# Patient Record
Sex: Female | Born: 1985 | Hispanic: Yes | Marital: Married | Smoking: Never smoker
Health system: Southern US, Community
[De-identification: ages and names within clinical notes are randomized; demographics above are authoritative.]

## PROBLEM LIST (undated history)

## (undated) ENCOUNTER — Emergency Department: Discharge status: Absent without leave | Payer: Self-pay

## (undated) DIAGNOSIS — D649 Anemia, unspecified: Secondary | ICD-10-CM

---

## 2011-06-18 ENCOUNTER — Emergency Department: Payer: Self-pay | Admitting: Emergency Medicine

## 2012-05-14 ENCOUNTER — Ambulatory Visit: Payer: Self-pay | Admitting: Family Medicine

## 2016-03-04 ENCOUNTER — Emergency Department
Admission: EM | Admit: 2016-03-04 | Discharge: 2016-03-04 | Disposition: A | Discharge status: Home / Self Care | Payer: Self-pay | Attending: Emergency Medicine | Admitting: Emergency Medicine

## 2016-03-04 ENCOUNTER — Emergency Department: Payer: Self-pay

## 2016-03-04 DIAGNOSIS — R0600 Dyspnea, unspecified: Secondary | ICD-10-CM

## 2016-03-04 DIAGNOSIS — R0789 Other chest pain: Secondary | ICD-10-CM | POA: Insufficient documentation

## 2016-03-04 LAB — BASIC METABOLIC PANEL
Anion gap: 6 (ref 5–15)
BUN: 15 mg/dL (ref 6–20)
CALCIUM: 8.9 mg/dL (ref 8.9–10.3)
CO2: 25 mmol/L (ref 22–32)
CREATININE: 0.57 mg/dL (ref 0.44–1.00)
Chloride: 108 mmol/L (ref 101–111)
GFR calc non Af Amer: >60 mL/min (ref >60)
Glucose, Bld: 112 mg/dL — ABNORMAL HIGH (ref 65–99)
Potassium: 3 mmol/L — ABNORMAL LOW (ref 3.5–5.1)
Sodium: 139 mmol/L (ref 135–145)

## 2016-03-04 LAB — CBC
HEMATOCRIT: 40.9 % (ref 35.0–47.0)
Hemoglobin: 13.4 g/dL (ref 12.0–16.0)
MCH: 25.4 pg — AB (ref 26.0–34.0)
MCHC: 32.9 g/dL (ref 32.0–36.0)
MCV: 77.3 fL — ABNORMAL LOW (ref 80.0–100.0)
Platelets: 192 10*3/uL (ref 150–440)
RBC: 5.29 MIL/uL — ABNORMAL HIGH (ref 3.80–5.20)
RDW: 14.4 % (ref 11.5–14.5)
WBC: 8.3 10*3/uL (ref 3.6–11.0)

## 2016-03-04 LAB — TROPONIN I: Troponin I: <0.03 ng/mL (ref <0.03)

## 2016-03-04 MED ORDER — LORAZEPAM 0.5 MG PO TABS: 0.5000 mg | ORAL_TABLET | Freq: Three times a day (TID) | ORAL | 0 refills | Status: AC | PRN

## 2016-03-04 NOTE — ED Triage Notes (Signed)
Patient to ED for complaints of shortness of breath since earlier today. States first episode was when she was driving and then again tonight was asleep, got up to go to the bathroom and felt pressure in her chest which was accompanied by shortness of breath.

## 2016-03-04 NOTE — ED Provider Notes (Signed)
[facility] Emergency Department Provider Note  Time seen: 5:16 AM  I have reviewed the triage vital signs and the nursing notes.   HISTORY  Chief Complaint Shortness of Breath    HPI Melissa Curry is a 30 y.o. female with no past medical history presents the emergency department with chest discomfort.According to the patient around 4 PM today she had a feeling of chest pressure and shortness breath, states it lasted 15 minutes or so and then went away. Patient states tonight around 4:00 in the morning she was awoken with an upset stomach like she had to have a bowel movement. States once again she felt chest pressure sensation so this time she came to the emergency department for evaluation. Denies any discomfort currently. Denies any trouble breathing. Denies any nausea or diaphoresis.  History reviewed. No pertinent past medical history.  There are no active problems to display for this patient.   History reviewed. No pertinent surgical history.  Prior to Admission medications   Not on File    No Known Allergies  No family history on file.  Social History Social History  Substance Use Topics  . Smoking status: Not on file  . Smokeless tobacco: Not on file  . Alcohol use Not on file    Review of Systems Constitutional: Negative for fever. Cardiovascular: Positive for chest pressure. Now resolved. Respiratory: Mild shortness of breath, now resolved. Gastrointestinal: Negative for abdominal pain Neurological: Negative for headache 10-point ROS otherwise negative.  ____________________________________________   PHYSICAL EXAM:  VITAL SIGNS: ED Triage Vitals  Enc Vitals Group     BP 03/04/16 0507 113/69     Pulse Rate 03/04/16 0507 67     Resp 03/04/16 0507 18     Temp 03/04/16 0507 97.8 F (36.6 C)     Temp Source 03/04/16 0507 Oral     SpO2 03/04/16 0507 100 %     Weight 03/04/16 0506 135 lb (61.2 kg)     Height 03/04/16 0506 5\' 2"   (1.575 m)     Head Circumference --      Peak Flow --      Pain Score 03/04/16 0506 0     Pain Loc --      Pain Edu? --      Excl. in GC? --     Constitutional: Alert and oriented. Well appearing and in no distress. Eyes: Normal exam ENT   Head: Normocephalic and atraumatic.   Mouth/Throat: Mucous membranes are moist. Cardiovascular: Normal rate, regular rhythm. No murmur Respiratory: Normal respiratory effort without tachypnea nor retractions. Breath sounds are clear  Gastrointestinal: Soft and nontender. No distention. Musculoskeletal: Nontender with normal range of motion in all extremities. No lower extremity tenderness or edema. Neurologic:  Normal speech and language. No gross focal neurologic deficits  Psychiatric: Mood and affect are normal. Speech and behavior are normal.   ____________________________________________    EKG  EKG reviewed and interpreted by myself shows normal sinus rhythm at 69 bpm, narrow QRS, normal axis, normal intervals, no ST changes, normal EKG.  ____________________________________________    RADIOLOGY  Chest x-ray negative  ____________________________________________   INITIAL IMPRESSION / ASSESSMENT AND PLAN / ED COURSE  Pertinent labs & imaging results that were available during my care of the patient were reviewed by me and considered in my medical decision making (see chart for details).  The patient presents with intermittent chest pressure and shortness of breath occurring twice over the past 12 hours. Denies any  currently. Overall the patient appears well with a very normal physical exam. We will check an EKG, labs and a chest x-ray. Patient agreeable to plan.  Chest x-ray negative. Labs are negative. We'll discharge the patient home with PCP follow-up, she continues to appear very well in the emergency department, pain-free.  ____________________________________________   FINAL CLINICAL IMPRESSION(S) / ED  DIAGNOSES  Chest pain    Minna Antis, MD 03/04/16 [phone]

## 2016-06-07 ENCOUNTER — Other Ambulatory Visit: Payer: Self-pay | Admitting: Family Medicine

## 2016-06-07 DIAGNOSIS — Z3481 Encounter for supervision of other normal pregnancy, first trimester: Secondary | ICD-10-CM

## 2016-06-13 ENCOUNTER — Ambulatory Visit
Admission: RE | Admit: 2016-06-13 | Discharge: 2016-06-13 | Disposition: A | Discharge status: Home / Self Care | Payer: Self-pay | Source: Ambulatory Visit | Attending: Family Medicine | Admitting: Family Medicine

## 2016-06-13 DIAGNOSIS — Z3481 Encounter for supervision of other normal pregnancy, first trimester: Secondary | ICD-10-CM

## 2016-06-13 DIAGNOSIS — Z3482 Encounter for supervision of other normal pregnancy, second trimester: Secondary | ICD-10-CM | POA: Insufficient documentation

## 2016-06-13 DIAGNOSIS — Z3A13 13 weeks gestation of pregnancy: Secondary | ICD-10-CM | POA: Insufficient documentation

## 2016-07-02 NOTE — L&D Delivery Note (Signed)
Delivery Note At 2:53 AM a viable female was delivered via Vaginal, Spontaneous Delivery (Presentation VTX: ;  ).  APGAR: 9, 9; weight  .   Placenta status:intact , .  Cord:nuchal - reduced . Delayed cord clamp after delivery   with the following complications:none .   Anesthesia:  lidocaine Episiotomy:  none Lacerations: 2nd degree( small) Suture Repair: 3.0 vicryl Est. Blood Loss (mL):  50cc  Mom to postpartum.  Baby to Couplet care / Skin to Skin.  Melissa Curry 12/20/2016, 3:25 AM

## 2016-07-17 ENCOUNTER — Other Ambulatory Visit: Payer: Self-pay | Admitting: Family Medicine

## 2016-07-17 DIAGNOSIS — Z3482 Encounter for supervision of other normal pregnancy, second trimester: Secondary | ICD-10-CM

## 2016-08-03 ENCOUNTER — Ambulatory Visit
Admission: RE | Admit: 2016-08-03 | Discharge: 2016-08-03 | Disposition: A | Discharge status: Home / Self Care | Payer: Medicaid Other | Source: Ambulatory Visit | Attending: Family Medicine | Admitting: Family Medicine

## 2016-08-03 DIAGNOSIS — Z3482 Encounter for supervision of other normal pregnancy, second trimester: Secondary | ICD-10-CM

## 2016-08-03 DIAGNOSIS — O321XX0 Maternal care for breech presentation, not applicable or unspecified: Secondary | ICD-10-CM | POA: Diagnosis not present

## 2016-08-03 DIAGNOSIS — Z3A20 20 weeks gestation of pregnancy: Secondary | ICD-10-CM | POA: Diagnosis not present

## 2016-11-05 ENCOUNTER — Other Ambulatory Visit: Payer: Self-pay | Admitting: Family Medicine

## 2016-11-05 DIAGNOSIS — Z3689 Encounter for other specified antenatal screening: Secondary | ICD-10-CM

## 2016-11-19 ENCOUNTER — Ambulatory Visit
Admission: RE | Admit: 2016-11-19 | Discharge: 2016-11-19 | Disposition: A | Discharge status: Home / Self Care | Payer: Medicaid Other | Source: Ambulatory Visit | Attending: Maternal & Fetal Medicine | Admitting: Maternal & Fetal Medicine

## 2016-11-19 ENCOUNTER — Ambulatory Visit: Payer: Medicaid Other

## 2016-11-19 DIAGNOSIS — Z3689 Encounter for other specified antenatal screening: Secondary | ICD-10-CM | POA: Insufficient documentation

## 2016-11-19 DIAGNOSIS — Z3A36 36 weeks gestation of pregnancy: Secondary | ICD-10-CM | POA: Insufficient documentation

## 2016-12-06 ENCOUNTER — Other Ambulatory Visit: Payer: Self-pay | Admitting: *Deleted

## 2016-12-06 DIAGNOSIS — O4103X1 Oligohydramnios, third trimester, fetus 1: Secondary | ICD-10-CM

## 2016-12-10 ENCOUNTER — Ambulatory Visit
Admission: RE | Admit: 2016-12-10 | Discharge: 2016-12-10 | Disposition: A | Discharge status: Home / Self Care | Payer: Medicaid Other | Source: Ambulatory Visit | Attending: Obstetrics and Gynecology | Admitting: Obstetrics and Gynecology

## 2016-12-10 DIAGNOSIS — O4103X1 Oligohydramnios, third trimester, fetus 1: Secondary | ICD-10-CM | POA: Diagnosis present

## 2016-12-10 DIAGNOSIS — Z3A39 39 weeks gestation of pregnancy: Secondary | ICD-10-CM | POA: Insufficient documentation

## 2016-12-20 ENCOUNTER — Encounter
Admission: EM | Disposition: A | Discharge status: Home / Self Care | Payer: Self-pay | Source: Home / Self Care | Attending: Obstetrics and Gynecology

## 2016-12-20 ENCOUNTER — Inpatient Hospital Stay: Payer: Medicaid Other | Admitting: Anesthesiology

## 2016-12-20 ENCOUNTER — Inpatient Hospital Stay
Admission: EM | Admit: 2016-12-20 | Discharge: 2016-12-21 | DRG: 767 | Disposition: A | Discharge status: Home / Self Care | Payer: Medicaid Other | Attending: Obstetrics and Gynecology | Admitting: Obstetrics and Gynecology

## 2016-12-20 ENCOUNTER — Encounter: Payer: Self-pay | Admitting: Obstetrics and Gynecology

## 2016-12-20 DIAGNOSIS — Z302 Encounter for sterilization: Secondary | ICD-10-CM

## 2016-12-20 DIAGNOSIS — Z3A39 39 weeks gestation of pregnancy: Secondary | ICD-10-CM

## 2016-12-20 DIAGNOSIS — O9089 Other complications of the puerperium, not elsewhere classified: Secondary | ICD-10-CM | POA: Diagnosis not present

## 2016-12-20 DIAGNOSIS — R55 Syncope and collapse: Secondary | ICD-10-CM | POA: Diagnosis not present

## 2016-12-20 DIAGNOSIS — O479 False labor, unspecified: Secondary | ICD-10-CM | POA: Diagnosis present

## 2016-12-20 HISTORY — PX: TUBAL LIGATION: SHX77

## 2016-12-20 LAB — TYPE AND SCREEN
ABO/RH(D): O POS
ANTIBODY SCREEN: NEGATIVE

## 2016-12-20 LAB — CBC
HCT: 31.9 % — ABNORMAL LOW (ref 35.0–47.0)
HEMATOCRIT: 35.6 % (ref 35.0–47.0)
HEMOGLOBIN: 10.1 g/dL — AB (ref 12.0–16.0)
Hemoglobin: 11.6 g/dL — ABNORMAL LOW (ref 12.0–16.0)
MCH: 24 pg — ABNORMAL LOW (ref 26.0–34.0)
MCH: 23.5 pg — ABNORMAL LOW (ref 26.0–34.0)
MCHC: 32.5 g/dL (ref 32.0–36.0)
MCHC: 31.7 g/dL — AB (ref 32.0–36.0)
MCV: 73.9 fL — ABNORMAL LOW (ref 80.0–100.0)
MCV: 74.1 fL — ABNORMAL LOW (ref 80.0–100.0)
PLATELETS: 210 10*3/uL (ref 150–440)
PLATELETS: 183 10*3/uL (ref 150–440)
RBC: 4.81 MIL/uL (ref 3.80–5.20)
RBC: 4.31 MIL/uL (ref 3.80–5.20)
RDW: 16 % — ABNORMAL HIGH (ref 11.5–14.5)
RDW: 15.6 % — AB (ref 11.5–14.5)
WBC: 11.6 10*3/uL — AB (ref 3.6–11.0)
WBC: 15.3 10*3/uL — ABNORMAL HIGH (ref 3.6–11.0)

## 2016-12-20 SURGERY — LIGATION, FALLOPIAN TUBE, POSTPARTUM
Anesthesia: Spinal

## 2016-12-20 MED ORDER — FERROUS SULFATE 325 (65 FE) MG PO TABS
325.0000 mg | ORAL_TABLET | Freq: Two times a day (BID) | ORAL | Status: DC
  Administered 2016-12-20 – 2016-12-21 (×2): 325 mg via ORAL
  Filled 2016-12-20 (×2): qty 1

## 2016-12-20 MED ORDER — FENTANYL CITRATE (PF) 100 MCG/2ML IJ SOLN: 25.0000 ug | INTRAMUSCULAR | Status: DC | PRN

## 2016-12-20 MED ORDER — OXYTOCIN 10 UNIT/ML IJ SOLN
INTRAMUSCULAR | Status: AC
  Filled 2016-12-20: qty 2

## 2016-12-20 MED ORDER — MEPERIDINE HCL 50 MG/ML IJ SOLN: 6.2500 mg | INTRAMUSCULAR | Status: DC | PRN

## 2016-12-20 MED ORDER — FENTANYL CITRATE (PF) 100 MCG/2ML IJ SOLN
INTRAMUSCULAR | Status: AC
  Filled 2016-12-20: qty 2

## 2016-12-20 MED ORDER — LACTATED RINGERS IV SOLN
INTRAVENOUS | Status: DC
  Administered 2016-12-20: 11:00:00 via INTRAVENOUS

## 2016-12-20 MED ORDER — ACETAMINOPHEN 325 MG PO TABS
650.0000 mg | ORAL_TABLET | ORAL | Status: DC | PRN
  Administered 2016-12-20: 650 mg via ORAL

## 2016-12-20 MED ORDER — OXYCODONE HCL 5 MG/5ML PO SOLN: 5.0000 mg | Freq: Once | ORAL | Status: DC | PRN

## 2016-12-20 MED ORDER — ZOLPIDEM TARTRATE 5 MG PO TABS: 5.0000 mg | ORAL_TABLET | Freq: Every evening | ORAL | Status: DC | PRN

## 2016-12-20 MED ORDER — SENNOSIDES-DOCUSATE SODIUM 8.6-50 MG PO TABS
2.0000 | ORAL_TABLET | ORAL | Status: DC
  Administered 2016-12-21: 2 via ORAL

## 2016-12-20 MED ORDER — ONDANSETRON HCL 4 MG/2ML IJ SOLN: 4.0000 mg | INTRAMUSCULAR | Status: DC | PRN

## 2016-12-20 MED ORDER — WITCH HAZEL-GLYCERIN EX PADS: 1.0000 "application " | MEDICATED_PAD | CUTANEOUS | Status: DC | PRN

## 2016-12-20 MED ORDER — MAGNESIUM HYDROXIDE 400 MG/5ML PO SUSP: 30.0000 mL | ORAL | Status: DC | PRN

## 2016-12-20 MED ORDER — LACTATED RINGERS IV SOLN
500.0000 mL | INTRAVENOUS | Status: DC | PRN
  Administered 2016-12-20: 10:00:00 via INTRAVENOUS

## 2016-12-20 MED ORDER — ACETAMINOPHEN 325 MG PO TABS
650.0000 mg | ORAL_TABLET | ORAL | Status: DC | PRN
  Administered 2016-12-20 – 2016-12-21 (×4): 650 mg via ORAL
  Filled 2016-12-20 (×5): qty 2

## 2016-12-20 MED ORDER — BUPIVACAINE HCL (PF) 0.5 % IJ SOLN
INTRAMUSCULAR | Status: AC
  Filled 2016-12-20: qty 30

## 2016-12-20 MED ORDER — BUPIVACAINE HCL 0.5 % IJ SOLN
INTRAMUSCULAR | Status: DC | PRN
  Administered 2016-12-20: 5 mL

## 2016-12-20 MED ORDER — OXYTOCIN 10 UNIT/ML IJ SOLN
INTRAMUSCULAR | Status: AC
  Administered 2016-12-20: 10 [IU] via INTRAMUSCULAR
  Filled 2016-12-20: qty 2

## 2016-12-20 MED ORDER — OXYCODONE HCL 5 MG PO TABS: 5.0000 mg | ORAL_TABLET | Freq: Once | ORAL | Status: DC | PRN

## 2016-12-20 MED ORDER — DIPHENHYDRAMINE HCL 25 MG PO CAPS: 25.0000 mg | ORAL_CAPSULE | Freq: Four times a day (QID) | ORAL | Status: DC | PRN

## 2016-12-20 MED ORDER — SIMETHICONE 80 MG PO CHEW: 80.0000 mg | CHEWABLE_TABLET | ORAL | Status: DC | PRN

## 2016-12-20 MED ORDER — OXYTOCIN 40 UNITS IN LACTATED RINGERS INFUSION - SIMPLE MED
INTRAVENOUS | Status: AC
  Filled 2016-12-20: qty 1000

## 2016-12-20 MED ORDER — IBUPROFEN 600 MG PO TABS
600.0000 mg | ORAL_TABLET | Freq: Four times a day (QID) | ORAL | Status: DC
  Administered 2016-12-20 – 2016-12-21 (×5): 600 mg via ORAL
  Filled 2016-12-20 (×4): qty 1

## 2016-12-20 MED ORDER — PROMETHAZINE HCL 25 MG/ML IJ SOLN: 6.2500 mg | INTRAMUSCULAR | Status: DC | PRN

## 2016-12-20 MED ORDER — MISOPROSTOL 200 MCG PO TABS
ORAL_TABLET | ORAL | Status: AC
  Filled 2016-12-20: qty 4

## 2016-12-20 MED ORDER — SOD CITRATE-CITRIC ACID 500-334 MG/5ML PO SOLN
30.0000 mL | ORAL | Status: DC | PRN
  Filled 2016-12-20: qty 30

## 2016-12-20 MED ORDER — KETOROLAC TROMETHAMINE 30 MG/ML IJ SOLN
INTRAMUSCULAR | Status: AC
  Filled 2016-12-20: qty 1

## 2016-12-20 MED ORDER — EPHEDRINE SULFATE 50 MG/ML IJ SOLN
INTRAMUSCULAR | Status: DC | PRN
  Administered 2016-12-20: 10 mg via INTRAVENOUS

## 2016-12-20 MED ORDER — AMMONIA AROMATIC IN INHA
RESPIRATORY_TRACT | Status: AC
  Filled 2016-12-20: qty 20

## 2016-12-20 MED ORDER — PRENATAL MULTIVITAMIN CH
1.0000 | ORAL_TABLET | Freq: Every day | ORAL | Status: DC
  Administered 2016-12-21: 1 via ORAL
  Filled 2016-12-20: qty 1

## 2016-12-20 MED ORDER — LIDOCAINE HCL (PF) 1 % IJ SOLN: 30.0000 mL | INTRAMUSCULAR | Status: DC | PRN

## 2016-12-20 MED ORDER — ONDANSETRON HCL 4 MG PO TABS: 4.0000 mg | ORAL_TABLET | ORAL | Status: DC | PRN

## 2016-12-20 MED ORDER — BUPIVACAINE IN DEXTROSE 0.75-8.25 % IT SOLN
INTRATHECAL | Status: DC | PRN
  Administered 2016-12-20: 2 mL via INTRATHECAL

## 2016-12-20 MED ORDER — COCONUT OIL OIL
1.0000 "application " | TOPICAL_OIL | Status: DC | PRN
  Administered 2016-12-20: 1 via TOPICAL
  Filled 2016-12-20: qty 120

## 2016-12-20 MED ORDER — MIDAZOLAM HCL 5 MG/5ML IJ SOLN
INTRAMUSCULAR | Status: DC | PRN
  Administered 2016-12-20 (×2): 1 mg via INTRAVENOUS

## 2016-12-20 MED ORDER — LIDOCAINE HCL (PF) 1 % IJ SOLN
INTRAMUSCULAR | Status: AC
  Filled 2016-12-20: qty 30

## 2016-12-20 MED ORDER — ONDANSETRON HCL 4 MG/2ML IJ SOLN: 4.0000 mg | Freq: Four times a day (QID) | INTRAMUSCULAR | Status: DC | PRN

## 2016-12-20 MED ORDER — FENTANYL CITRATE (PF) 100 MCG/2ML IJ SOLN
INTRAMUSCULAR | Status: DC | PRN
  Administered 2016-12-20: 50 ug via INTRAVENOUS
  Administered 2016-12-20: 25 ug via INTRAVENOUS

## 2016-12-20 MED ORDER — OXYTOCIN 40 UNITS IN LACTATED RINGERS INFUSION - SIMPLE MED: 2.5000 [IU]/h | INTRAVENOUS | Status: DC

## 2016-12-20 MED ORDER — BENZOCAINE-MENTHOL 20-0.5 % EX AERO: 1.0000 "application " | INHALATION_SPRAY | CUTANEOUS | Status: DC | PRN

## 2016-12-20 MED ORDER — DIBUCAINE 1 % RE OINT: 1.0000 "application " | TOPICAL_OINTMENT | RECTAL | Status: DC | PRN

## 2016-12-20 MED ORDER — OXYTOCIN BOLUS FROM INFUSION: 500.0000 mL | Freq: Once | INTRAVENOUS | Status: DC

## 2016-12-20 MED ORDER — AMMONIA AROMATIC IN INHA
RESPIRATORY_TRACT | Status: AC
  Filled 2016-12-20: qty 10

## 2016-12-20 MED ORDER — OXYCODONE-ACETAMINOPHEN 5-325 MG PO TABS: 1.0000 | ORAL_TABLET | ORAL | Status: DC | PRN

## 2016-12-20 MED ORDER — OXYCODONE-ACETAMINOPHEN 5-325 MG PO TABS: 2.0000 | ORAL_TABLET | ORAL | Status: DC | PRN

## 2016-12-20 MED ORDER — IBUPROFEN 600 MG PO TABS
ORAL_TABLET | ORAL | Status: AC
  Administered 2016-12-20: 600 mg via ORAL
  Filled 2016-12-20: qty 1

## 2016-12-20 MED ORDER — PROPOFOL 10 MG/ML IV BOLUS
INTRAVENOUS | Status: AC
  Filled 2016-12-20: qty 20

## 2016-12-20 MED ORDER — MEASLES, MUMPS & RUBELLA VAC ~~LOC~~ INJ
0.5000 mL | INJECTION | Freq: Once | SUBCUTANEOUS | Status: DC
  Filled 2016-12-20: qty 0.5

## 2016-12-20 MED ORDER — BUTORPHANOL TARTRATE 1 MG/ML IJ SOLN: 1.0000 mg | INTRAMUSCULAR | Status: DC | PRN

## 2016-12-20 MED ORDER — KETOROLAC TROMETHAMINE 30 MG/ML IJ SOLN
INTRAMUSCULAR | Status: DC | PRN
  Administered 2016-12-20: 30 mg via INTRAVENOUS

## 2016-12-20 MED ORDER — BENZOCAINE-MENTHOL 20-0.5 % EX AERO
INHALATION_SPRAY | CUTANEOUS | Status: AC
  Filled 2016-12-20: qty 56

## 2016-12-20 MED ORDER — PHENYLEPHRINE HCL 10 MG/ML IJ SOLN
INTRAMUSCULAR | Status: DC | PRN
  Administered 2016-12-20: 50 ug via INTRAVENOUS

## 2016-12-20 MED ORDER — MIDAZOLAM HCL 2 MG/2ML IJ SOLN
INTRAMUSCULAR | Status: AC
  Filled 2016-12-20: qty 2

## 2016-12-20 SURGICAL SUPPLY — 32 items
APPLICATOR COTTON TIP 6IN STRL (MISCELLANEOUS) ×3 IMPLANT
BLADE SURG SZ11 CARB STEEL (BLADE) ×3 IMPLANT
CANISTER SUCT 1200ML W/VALVE (MISCELLANEOUS) ×3 IMPLANT
CHLORAPREP W/TINT 26ML (MISCELLANEOUS) ×3 IMPLANT
CLOSURE WOUND 1/4X4 (GAUZE/BANDAGES/DRESSINGS) ×1
DERMABOND ADVANCED (GAUZE/BANDAGES/DRESSINGS) ×2
DERMABOND ADVANCED .7 DNX12 (GAUZE/BANDAGES/DRESSINGS) ×1 IMPLANT
DRAPE PED LAPAROTOMY (DRAPES) ×3 IMPLANT
DRSG TEGADERM 2-3/8X2-3/4 SM (GAUZE/BANDAGES/DRESSINGS) ×3 IMPLANT
DRSG TEGADERM 4X4.75 (GAUZE/BANDAGES/DRESSINGS) ×3 IMPLANT
ELECT CAUTERY BLADE 6.4 (BLADE) ×3 IMPLANT
ELECT REM PT RETURN 9FT ADLT (ELECTROSURGICAL) ×3
ELECTRODE REM PT RTRN 9FT ADLT (ELECTROSURGICAL) ×1 IMPLANT
GAUZE SPONGE NON-WVN 2X2 STRL (MISCELLANEOUS) ×1 IMPLANT
GLOVE BIO SURGEON STRL SZ8 (GLOVE) ×12 IMPLANT
GOWN STRL REUS W/ TWL LRG LVL3 (GOWN DISPOSABLE) ×1 IMPLANT
GOWN STRL REUS W/ TWL XL LVL3 (GOWN DISPOSABLE) ×1 IMPLANT
GOWN STRL REUS W/TWL LRG LVL3 (GOWN DISPOSABLE) ×2
GOWN STRL REUS W/TWL XL LVL3 (GOWN DISPOSABLE) ×2
KIT RM TURNOVER STRD PROC AR (KITS) ×3 IMPLANT
LABEL OR SOLS (LABEL) ×3 IMPLANT
NEEDLE HYPO 25X1 1.5 SAFETY (NEEDLE) ×3 IMPLANT
NS IRRIG 500ML POUR BTL (IV SOLUTION) ×3 IMPLANT
PACK BASIN MINOR ARMC (MISCELLANEOUS) ×3 IMPLANT
SPONGE VERSALON 2X2 STRL (MISCELLANEOUS) ×2
STRIP CLOSURE SKIN 1/4X4 (GAUZE/BANDAGES/DRESSINGS) ×2 IMPLANT
SUT PLAIN GUT 0 (SUTURE) ×6 IMPLANT
SUT VIC AB 2-0 UR6 27 (SUTURE) ×3 IMPLANT
SUT VIC AB 4-0 SH 27 (SUTURE) ×2
SUT VIC AB 4-0 SH 27XANBCTRL (SUTURE) ×1 IMPLANT
SWABSTK COMLB BENZOIN TINCTURE (MISCELLANEOUS) ×3 IMPLANT
SYRINGE 10CC LL (SYRINGE) ×3 IMPLANT

## 2016-12-20 NOTE — Progress Notes (Signed)
0615 fundal assessment found fundus deviated to right. Fundus firm and one above umbilicus. Pt up to BR, encouraged to void. Pt also c/o increased abd cramping. Pt voided and 2 clots also expelled while on BR. Unable to determine size of clots due to blood in water. Upon standing pt reported feeling dizzy. Pt eased to floor and called for assistance. IV bolus started, ammonia given. Within 5 min pt able to stand to wheelchair and to bed. Fundus firm and more midline. Dr Feliberto GottronSchermerhorn called, informed of occurrence and IV pitocin started. Stat cbc ordered.

## 2016-12-20 NOTE — Anesthesia Postprocedure Evaluation (Signed)
Anesthesia Post Note  Patient: Clayburn PertDeysi Mower  Procedure(s) Performed: Procedure(s) (LRB): POST PARTUM TUBAL LIGATION (N/A)  Patient location during evaluation: PACU Anesthesia Type: Spinal Level of consciousness: awake and alert and oriented Pain management: pain level controlled Vital Signs Assessment: post-procedure vital signs reviewed and stable Respiratory status: spontaneous breathing, nonlabored ventilation and respiratory function stable Cardiovascular status: blood pressure returned to baseline and stable Postop Assessment: no signs of nausea or vomiting Anesthetic complications: no     Last Vitals:  Vitals:   12/20/16 1223 12/20/16 1239  BP: 113/69 115/72  Pulse: (!) 57 (!) 55  Resp: 13 15  Temp:      Last Pain:  Vitals:   12/20/16 1239  TempSrc:   PainSc: 0-No pain                 Kendryck Lacroix

## 2016-12-20 NOTE — Transfer of Care (Signed)
Immediate Anesthesia Transfer of Care Note  Patient: Melissa Curry  Procedure(s) Performed: Procedure(s): POST PARTUM TUBAL LIGATION (N/A)  Patient Location: PACU  Anesthesia Type:Spinal  Level of Consciousness: awake  Airway & Oxygen Therapy: Patient Spontanous Breathing and Patient connected to nasal cannula oxygen  Post-op Assessment: Report given to RN and Post -op Vital signs reviewed and stable  Post vital signs: Reviewed and stable  Last Vitals:  Vitals:   12/20/16 0836 12/20/16 0901  BP: 120/85 116/78  Pulse: 74 65  Resp:  18  Temp:  36.9 C    Last Pain:  Vitals:   12/20/16 0925  TempSrc:   PainSc: 0-No pain         Complications: No apparent anesthesia complications

## 2016-12-20 NOTE — Discharge Summary (Signed)
  Obstetrical Discharge Summary  Patient Name: Melissa Curry DOB: 1985-12-09 MRN: [id number]  Date of Admission: 12/20/2016 Date of Delivery: 12/20/16 Delivered by: Schermerhorn MD Date of Discharge: 12/21/16 Primary OB: [facility] LMP:No LMP recorded (lmp unknown). EDC Estimated Date of Delivery: 12/18/16 Gestational Age at Delivery: 8440+2  Admitting Diagnosis: active labor Secondary Diagnosis: Patient Active Problem List   Diagnosis Date Noted  . Uterine contractions during pregnancy 12/20/2016  . Delivery normal 12/20/2016    Augmentation: none Complications: None Intrapartum complications/course:precipitous labor and delivery Date of Delivery: 12/20/16 Delivered By: SchermerhornMD Delivery Type: spontaneous vaginal delivery Anesthesia: epidural Placenta: sponatneous Laceration: small second  Episiotomy: none Newborn Data: Live born female  Birth Weight: 7 lb 1.9 oz (3230 g) APGAR: 9, 9  Postpartum Procedures: PP BTL   Post partum course:  Patient had an uncomplicated postpartum course.  By time of discharge on PPD#1, her pain was controlled on oral pain medications; she had appropriate lochia and was ambulating, voiding without difficulty and tolerating regular diet.  She was deemed stable for discharge to home.     Discharge Physical Exam:  BP 102/67 (BP Location: Left Arm)   Pulse 61   Temp 98.1 F (36.7 C) (Oral)   Resp 20   Ht 5\' 2"  (1.575 m)   Wt 74.8 kg (165 lb)   LMP  (LMP Unknown)   SpO2 100%   Breastfeeding? Unknown   BMI 30.18 kg/m   General: NAD CV: RRR Pulm: CTABL, nl effort ABD: s/nd/nt, fundus firm and below the umbilicus Lochia: moderate Incision: c/d/i DVT Evaluation: LE non-ttp, no evidence of DVT on exam.  Hemoglobin  Date Value Ref Range Status  12/21/2016 8.6 (L) 12.0 - 16.0 g/dL Final   HCT  Date Value Ref Range Status  12/21/2016 26.6 (L) 35.0 - 47.0 % Final     Disposition: stable, discharge to home. Baby Disposition: home with  mom Contraception: postpartum tubal ligation   Plan:  Melissa Curry was discharged to home in good condition. [facility] for postop visit with Dr. Feliberto GottronSchermerhorn   Discharge Medications: Allergies as of 12/21/2016   No Known Allergies     Medication List    TAKE these medications   docusate sodium 100 MG capsule Commonly known as:  COLACE Take 1 capsule (100 mg total) by mouth 2 (two) times daily as needed for mild constipation.   ferrous sulfate 325 (65 FE) MG tablet Take 1 tablet (325 mg total) by mouth daily with breakfast.   ibuprofen 600 MG tablet Commonly known as:  ADVIL,MOTRIN Take 1 tablet (600 mg total) by mouth every 6 (six) hours.   LORazepam 0.5 MG tablet Commonly known as:  ATIVAN Take 1 tablet (0.5 mg total) by mouth every 8 (eight) hours as needed for anxiety.   multivitamin-prenatal 27-0.8 MG Tabs tablet Take 1 tablet by mouth daily at 12 noon.   oxyCODONE-acetaminophen 5-325 MG tablet Commonly known as:  PERCOCET/ROXICET Take 1-2 tablets by mouth every 6 (six) hours as needed.         SignedChristeen Douglas: Kendon Sedeno 8:40 AM 12/21/16

## 2016-12-20 NOTE — Brief Op Note (Signed)
12/20/2016  10:55 AM  PATIENT:  Melissa Curry  31 y.o. female  PRE-OPERATIVE DIAGNOSIS:  desire for sterility  POST-OPERATIVE DIAGNOSIS:  desire for sterility  PROCEDURE:  Procedure(s): POST PARTUM TUBAL LIGATION (N/A)  SURGEON:  Surgeon(s) and Role:    * Karrah Mangini, Ihor Austinhomas J, MD - Primary  PHYSICIAN ASSISTANT:   ASSISTANTS: none   ANESTHESIA:   spinal  EBL:  Total I/O In: 400 [I.V.:400] Out: - minimal   BLOOD ADMINISTERED:none  DRAINS: none   LOCAL MEDICATIONS USED:  MARCAINE     SPECIMEN:  Source of Specimen:  portion of bilateral tube  DISPOSITION OF SPECIMEN:  PATHOLOGY  COUNTS:  YES  TOURNIQUET:  * No tourniquets in log *  DICTATION: .Other Dictation: Dictation Number verbal  PLAN OF CARE: continue PP care on postpartum   PATIENT DISPOSITION:  PACU - hemodynamically stable.   Delay start of Pharmacological VTE agent (>24hrs) due to surgical blood loss or risk of bleeding: not applicable

## 2016-12-20 NOTE — Progress Notes (Signed)
Patient last void was at 0457. Patient attempted to void at 1540 but was unable to void at this time. Due to this, patient was bladder scanned at 1550 which showed 870 mL. Patient in and out cathed at 1610 and 1000 mL of urine was removed from bladder. Will continue to monitor patient, RN will have patient up to try and void again in a few hours.     Oswald HillockAbigail Garner, RN

## 2016-12-20 NOTE — Anesthesia Preprocedure Evaluation (Signed)
Anesthesia Evaluation  Patient identified by MRN, date of birth, ID band Patient awake    Reviewed: Allergy & Precautions, NPO status , Patient's Chart, lab work & pertinent test results  History of Anesthesia Complications Negative for: history of anesthetic complications  Airway Mallampati: II  TM Distance: >3 FB Neck ROM: Full    Dental no notable dental hx.    Pulmonary neg pulmonary ROS, neg sleep apnea, neg COPD,    breath sounds clear to auscultation- rhonchi (-) wheezing      Cardiovascular Exercise Tolerance: Good (-) hypertension(-) CAD and (-) Past MI  Rhythm:Regular Rate:Normal - Systolic murmurs and - Diastolic murmurs    Neuro/Psych negative neurological ROS  negative psych ROS   GI/Hepatic negative GI ROS, Neg liver ROS,   Endo/Other  negative endocrine ROSneg diabetes  Renal/GU negative Renal ROS     Musculoskeletal negative musculoskeletal ROS (+)   Abdominal (+) - obese,   Peds  Hematology negative hematology ROS (+)   Anesthesia Other Findings   Reproductive/Obstetrics (+) Breast feeding                              Anesthesia Physical Anesthesia Plan  ASA: I  Anesthesia Plan: Spinal   Post-op Pain Management:    Induction:   PONV Risk Score and Plan: 2 and Ondansetron and Midazolam  Airway Management Planned: Natural Airway  Additional Equipment:   Intra-op Plan:   Post-operative Plan:   Informed Consent: I have reviewed the patients History and Physical, chart, labs and discussed the procedure including the risks, benefits and alternatives for the proposed anesthesia with the patient or authorized representative who has indicated his/her understanding and acceptance.   Dental advisory given  Plan Discussed with: Anesthesiologist and CRNA  Anesthesia Plan Comments:         Lab Results  Component Value Date   WBC 15.3 (H) 12/20/2016   HGB  10.1 (L) 12/20/2016   HCT 31.9 (L) 12/20/2016   MCV 74.1 (L) 12/20/2016   PLT 183 12/20/2016    Anesthesia Quick Evaluation

## 2016-12-20 NOTE — Anesthesia Post-op Follow-up Note (Cosign Needed)
Anesthesia QCDR form completed.        

## 2016-12-20 NOTE — Anesthesia Procedure Notes (Signed)
Spinal  Patient location during procedure: OR Start time: 12/20/2016 10:15 AM End time: 12/20/2016 10:28 AM Staffing Anesthesiologist: Randa Lynn AMY Resident/CRNA: Leyla Soliz Performed: anesthesiologist and resident/CRNA  Preanesthetic Checklist Completed: patient identified, site marked, surgical consent, pre-op evaluation, timeout performed, IV checked, risks and benefits discussed and monitors and equipment checked Spinal Block Patient position: sitting Prep: ChloraPrep Patient monitoring: heart rate, continuous pulse ox, blood pressure and cardiac monitor Approach: midline Location: L3-4 Injection technique: single-shot Needle Needle type: Introducer and Pencil-Tip  Needle gauge: 24 G Needle length: 9 cm Additional Notes Negative paresthesia. Negative blood return. Positive free-flowing CSF. Expiration date of kit checked and confirmed. Patient tolerated procedure well, without complications.

## 2016-12-20 NOTE — Progress Notes (Signed)
Patient transported to OR for BTL at this time.   Oswald HillockAbigail Garner, RN

## 2016-12-20 NOTE — Progress Notes (Signed)
  Proceed with PP BTL Patient ID: Melissa Curry, female   DOB: August 03, 1985, 31 y.o.   MRN: [id number] Presyncopal episode after delivery . HCT stable . Pt feels better this am

## 2016-12-20 NOTE — Anesthesia Procedure Notes (Signed)
Date/Time: 12/20/2016 10:15 AM Performed by: Henrietta HooverPOPE, Ashlyne Olenick Pre-anesthesia Checklist: Patient identified, Emergency Drugs available, Suction available, Patient being monitored and Timeout performed Patient Re-evaluated:Patient Re-evaluated prior to inductionOxygen Delivery Method: Nasal cannula Placement Confirmation: positive ETCO2

## 2016-12-20 NOTE — H&P (Signed)
Melissa Curry is a 31 y.o. female presenting for active Labor39+4 week by U/S and unsure LMP . OB History    Gravida Para Term Preterm AB Living   3 3 3     3    SAB TAB Ectopic Multiple Live Births         0 3     Past Medical History:  Diagnosis Date  . Medical history non-contributory    History reviewed. No pertinent surgical history. Family History: family history is not on file. Social History:  reports that she has never smoked. She has never used smokeless tobacco. She reports that she does not drink alcohol or use drugs.     Maternal Diabetes: No Genetic Screening: Declined Maternal Ultrasounds/Referrals: Normal Fetal Ultrasounds or other Referrals:  None Maternal Substance Abuse:  No Significant Maternal Medications:  None Significant Maternal Lab Results:  None Other Comments:  None  ROS History Dilation: 10 Effacement (%): 100 Blood pressure (!) 136/93, pulse 79, temperature 98 F (36.7 C), temperature source Oral, resp. rate (!) 22, height 5\' 2"  (1.575 m), weight 74.8 kg (165 lb), unknown if currently breastfeeding. Exam  Lungs CTA CV RRR Physical Exam  Prenatal labs: ABO, Rh:  0+ Antibody:  neg  Rubella:  IMM, var Imm RPR:   neg HBsAg:   neg HIV:   neg GBS:   neg   Assessment/Plan: Precipitous delivery - see delivery note    Melissa Curry 12/20/2016, 3:20 AM

## 2016-12-21 LAB — CBC
HEMATOCRIT: 26.6 % — AB (ref 35.0–47.0)
HEMOGLOBIN: 8.6 g/dL — AB (ref 12.0–16.0)
MCH: 24.3 pg — AB (ref 26.0–34.0)
MCHC: 32.3 g/dL (ref 32.0–36.0)
MCV: 75.3 fL — ABNORMAL LOW (ref 80.0–100.0)
Platelets: 155 10*3/uL (ref 150–440)
RBC: 3.53 MIL/uL — ABNORMAL LOW (ref 3.80–5.20)
RDW: 15.6 % — ABNORMAL HIGH (ref 11.5–14.5)
WBC: 8.3 10*3/uL (ref 3.6–11.0)

## 2016-12-21 LAB — RPR: RPR Ser Ql: NONREACTIVE

## 2016-12-21 MED ORDER — IBUPROFEN 600 MG PO TABS: 600.0000 mg | ORAL_TABLET | Freq: Four times a day (QID) | ORAL | 0 refills | Status: AC

## 2016-12-21 MED ORDER — DOCUSATE SODIUM 100 MG PO CAPS: 100.0000 mg | ORAL_CAPSULE | Freq: Two times a day (BID) | ORAL | 0 refills | Status: AC | PRN

## 2016-12-21 MED ORDER — DOCUSATE SODIUM 100 MG PO CAPS: 100.0000 mg | ORAL_CAPSULE | Freq: Two times a day (BID) | ORAL | Status: DC | PRN

## 2016-12-21 MED ORDER — OXYCODONE-ACETAMINOPHEN 5-325 MG PO TABS: 1.0000 | ORAL_TABLET | Freq: Four times a day (QID) | ORAL | 0 refills | Status: AC | PRN

## 2016-12-21 MED ORDER — FERROUS SULFATE 325 (65 FE) MG PO TABS: 325.0000 mg | ORAL_TABLET | Freq: Every day | ORAL | 3 refills | Status: AC

## 2016-12-21 NOTE — Brief Op Note (Signed)
12/20/2016  2:13 PM  PATIENT:  Melissa Curry  31 y.o. female  PRE-OPERATIVE DIAGNOSIS:  desire for sterility  POST-OPERATIVE DIAGNOSIS:  desire for sterility  PROCEDURE:  Procedure(s): POST PARTUM TUBAL LIGATION (N/A)  SURGEON:  Surgeon(s) and Role:     Schermerhorn, Ihor Austinhomas J, MD - Primary  PHYSICIAN ASSISTANT:   ASSISTANTS: none   ANESTHESIA:   geta EBL:  Total I/O In: 240 [P.O.:240] Out: - min ebl  BLOOD ADMINISTERED:none DRAINS: none LOCAL MEDICATIONS USED: marcaine SPECIMEN:  Portion right and left tube DISPOSITION OF SPECIMEN:  pathology COUNTS:  Yes TOURNIQUET:  none  DICTATION: verbal  PLAN OF CARE: cont pp care  PATIENT DISPOSITION:    Ihor Austinhomas J SchermerhornMD 12/20/16

## 2016-12-21 NOTE — Progress Notes (Signed)
Discharge order received from doctor. Reviewed discharge instructions and prescriptions with patient and answered all questions. Follow up appointment given. Patient verbalized understanding. ID bands checked. Patient discharged home with infant via wheelchair by nursing/auxillary.   Bilbo Carcamo Garner, RN  

## 2016-12-21 NOTE — Op Note (Signed)
[name]:  Dannemiller, [name]               ACCOUNT NO.:  [id number]  MEDICAL RECORD NO.:  [id number]  LOCATION:  LDR1                         FACILITY:  [facility]  PHYSICIAN:  Jennell Cornerhomas Koreen Lizaola, MDDATE OF BIRTH:  Sep 19, 1985  DATE OF PROCEDURE:  12/20/2016 DATE OF DISCHARGE:                              OPERATIVE REPORT   PREOPERATIVE DIAGNOSIS:  Elective permanent sterilization.  POSTOPERATIVE DIAGNOSIS:  Elective permanent sterilization.  PROCEDURE PERFORMED:  Bilateral tubal ligation, [place].  SURGEON:  Jennell Cornerhomas Evyn Kooyman, MD  SURGEON:  Jennell Cornerhomas Danamarie Minami, MD  FIRST ASSISTANT:  Certified scrub tech, [place].  INDICATION:  A 31 year old recently status post uncomplicated spontaneous vaginal delivery.  Patient has elected for permanent sterilization.  Medicaid consent form previously signed.  DESCRIPTION OF PROCEDURE:  After adequate general endotracheal anesthesia, the patient was placed in dorsal supine position.  The patient's abdomen was prepped and draped in normal sterile fashion. Time-out was performed.  A 15 mm infraumbilical incision was made, and the fascia was opened sharply and the peritoneum was identified and opened sharply as well.  The right fallopian tube was identified, grasped with a Babcock clamp, and 2 separate 0 plain gut sutures were applied to the fallopian tube and a 1.5 cm portion of fallopian tube was removed.  Good hemostasis was noted.  Similar procedure was repeated on the patient's left fallopian tube after identifying the fimbriated end of the fallopian tube, 2 separate 0 plain gut sutures were applied to the midportion of the fallopian tube and a 1.5 cm portion of the fallopian tube was removed.  Good hemostasis noted.  Fascia was closed with a running 2-0 Vicryl suture, and the skin was reapproximated with interrupted 4-0 Vicryl suture.  INTRAOPERATIVE FLUIDS:  400 mL.  ESTIMATED BLOOD LOSS:  Minimal.  The patient tolerated the procedure well,  was taken to the recovery room in good condition.          ______________________________ Jennell Cornerhomas Arne Schlender, MD     TS/MEDQ  D:  12/21/2016  T:  12/21/2016  Job:  [id number]

## 2016-12-21 NOTE — Discharge Instructions (Signed)
Please call your doctor or return to the ER if you experience any chest pains, shortness of breath, dizziness, visual changes, fever greater than 101, any heavy bleeding (saturating more than 1 pad per hour), large clots, or foul smelling discharge, any worsening abdominal pain and cramping that is not controlled by pain medication, or any signs of postpartum depression. No tampons, enemas, douches, or sexual intercourse for 6 weeks. Also avoid tub baths, hot tubs, or swimming for 6 weeks.  °

## 2016-12-24 LAB — SURGICAL PATHOLOGY

## 2017-01-18 ENCOUNTER — Encounter
Admission: RE | Admit: 2017-01-18 | Discharge: 2017-01-18 | Disposition: A | Discharge status: Home / Self Care | Payer: Medicaid Other | Source: Ambulatory Visit | Attending: Obstetrics and Gynecology | Admitting: Obstetrics and Gynecology

## 2017-01-18 NOTE — Patient Instructions (Signed)
  Your procedure is scheduled on: 01-29-17 TUESDAY Report to Same Day Surgery 2nd floor medical mall [facility](Medical Mall Entrance-take elevator on left to 2nd floor.  Check in with surgery information desk.) To find out your arrival time please call [phone] between 1PM - 3PM on 01-27-17 MONDAY  Remember: Instructions that are not followed completely may result in serious medical risk, up to and including death, or upon the discretion of your surgeon and anesthesiologist your surgery may need to be rescheduled.    _x___ 1. Do not eat food or drink liquids after midnight. No gum chewing or hard candies.     __x__ 2. No Alcohol for 24 hours before or after surgery.   __x__3. No Smoking for 24 prior to surgery.   ____  4. Bring all medications with you on the day of surgery if instructed.    __x__ 5. Notify your doctor if there is any change in your medical condition     (cold, fever, infections).     Do not wear jewelry, make-up, hairpins, clips or nail polish.  Do not wear lotions, powders, or perfumes. You may wear deodorant.  Do not shave 48 hours prior to surgery. Men may shave face and neck.  Do not bring valuables to the hospital.    [facility] is not responsible for any belongings or valuables.               Contacts, dentures or bridgework may not be worn into surgery.  Leave your suitcase in the car. After surgery it may be brought to your room.  For patients admitted to the hospital, discharge time is determined by your treatment team.   Patients discharged the day of surgery will not be allowed to drive home.  You will need someone to drive you home and stay with you the night of your procedure.    Please read over the following fact sheets that you were given:    ____ Take anti-hypertensive (unless it includes a diuretic), cardiac, seizure, asthma,     anti-reflux and psychiatric medicines. These include:  1. NONE  2.  3.  4.  5.  6.  ____Fleets enema or Magnesium  Citrate as directed.   _x___ Use CHG Soap or sage wipes as directed on instruction sheet   ____ Use inhalers on the day of surgery and bring to hospital day of surgery  ____ Stop Metformin and Janumet 2 days prior to surgery.    ____ Take 1/2 of usual insulin dose the night before surgery and none on the morning surgery.   ____ Follow recommendations from Cardiologist, Pulmonologist or PCP regarding stopping Aspirin, Coumadin, Pllavix ,Eliquis, Effient, or Pradaxa, and Pletal.  X____Stop Anti-inflammatories such as Advil, Aleve, Ibuprofen, Motrin, Naproxen, Naprosyn, Goodies powders or aspirin products NOW-OK to take Tylenol    ____ Stop supplements until after surgery.     ____ Bring C-Pap to the hospital.

## 2017-01-18 NOTE — H&P (Signed)
Ms. Melissa Curry is a 31 y.o. female here for Post Operative Visit . Pt here for follow up for a PP tubal on 12/20/16 that failed to show any portion of Right tube . Pt desires to have a follow on procedure ( L/S right tubal / cautery to ensure she is sterile)  Past Medical History:  has no past medical history on file.  Past Surgical History:  has a past surgical history that includes Tubal ligation. Family History: family history is not on file. Social History:  reports that she has never smoked. She has never used smokeless tobacco. She reports that she does not drink alcohol. OB/GYN History:          OB History    Gravida Para Term Preterm AB Living   3 3       3    SAB TAB Ectopic Molar Multiple Live Births             3      Allergies: has No Known Allergies. Medications:  Current Outpatient Prescriptions:  .  ferrous sulfate 325 (65 FE) MG tablet, Take by mouth., Disp: , Rfl:  .  prenatal vits96-iron fum-folic 27 mg iron- 800 mcg Tab, Take by mouth., Disp: , Rfl:   Review of Systems: General:                      No fatigue or weight loss Eyes:                           No vision changes Ears:                            No hearing difficulty Respiratory:                No cough or shortness of breath Pulmonary:                  No asthma or shortness of breath Cardiovascular:           No chest pain, palpitations, dyspnea on exertion Gastrointestinal:          No abdominal bloating, chronic diarrhea, constipations, masses, pain or hematochezia Genitourinary:             No hematuria, dysuria, abnormal vaginal discharge, pelvic pain, Menometrorrhagia Lymphatic:                   No swollen lymph nodes Musculoskeletal:         No muscle weakness Neurologic:                  No extremity weakness, syncope, seizure disorder Psychiatric:                  No history of depression, delusions or suicidal/homicidal ideation    Exam:      Vitals:   01/08/17 1441   BP: 112/70  Pulse: 83    Body mass index is 24.46 kg/m.  WDWN hispanic female in NAD   Lungs: CTA  CV : RRR without murmur    Neck:  no thyromegaly Abdomen: soft , no mass, normal active bowel sounds,  non-tender, no rebound tenderness Pelvic: tanner stage 5 ,  External genitalia: vulva /labia no lesions Urethra: no prolapse Vagina: normal physiologic d/c Cervix: no lesions, no cervical motion tenderness   Uterus: normal size shape and contour,  non-tender Adnexa: no mass,  non-tender   Rectovaginal:   Impression:   The encounter diagnosis was Encounter for sterilization.PPBTL without evidence of successful procedure     Plan:   L/S BTL  Benefits and risks to surgery: The proposed benefit of the surgery has been discussed with the patient. The possible risks include, but are not limited to: organ injury to the bowel , bladder, ureters, and major blood vessels and nerves. There is a possibility of additional surgeries resulting from these injuries. There is also the risk of blood transfusion and the need to receive blood products during or after the procedure which may rarely lead to HIV or Hepatitis C infection. There is a risk of developing a deep venous thrombosis or a pulmonary embolism . There is the possibility of wound infection and also anesthetic complications, even the rare possibility of death. The patient understands these risks and wishes to proceed. All questions have been answered and the consent has been signed.    Vilma Prader, MD

## 2017-01-23 ENCOUNTER — Encounter
Admission: RE | Admit: 2017-01-23 | Discharge: 2017-01-23 | Disposition: A | Discharge status: Home / Self Care | Payer: Medicaid Other | Source: Ambulatory Visit | Attending: Obstetrics and Gynecology | Admitting: Obstetrics and Gynecology

## 2017-01-23 DIAGNOSIS — Z302 Encounter for sterilization: Secondary | ICD-10-CM | POA: Diagnosis not present

## 2017-01-23 DIAGNOSIS — Z01812 Encounter for preprocedural laboratory examination: Secondary | ICD-10-CM | POA: Diagnosis present

## 2017-01-23 LAB — BASIC METABOLIC PANEL
Anion gap: 9 (ref 5–15)
BUN: 11 mg/dL (ref 6–20)
CALCIUM: 9.2 mg/dL (ref 8.9–10.3)
CO2: 25 mmol/L (ref 22–32)
CREATININE: 0.53 mg/dL (ref 0.44–1.00)
Chloride: 105 mmol/L (ref 101–111)
GFR calc non Af Amer: >60 mL/min (ref >60)
Glucose, Bld: 72 mg/dL (ref 65–99)
Potassium: 4 mmol/L (ref 3.5–5.1)
Sodium: 139 mmol/L (ref 135–145)

## 2017-01-23 LAB — CBC
HCT: 35.6 % (ref 35.0–47.0)
Hemoglobin: 11.3 g/dL — ABNORMAL LOW (ref 12.0–16.0)
MCH: 23.7 pg — AB (ref 26.0–34.0)
MCHC: 31.8 g/dL — AB (ref 32.0–36.0)
MCV: 74.5 fL — ABNORMAL LOW (ref 80.0–100.0)
Platelets: 212 10*3/uL (ref 150–440)
RBC: 4.78 MIL/uL (ref 3.80–5.20)
RDW: 17.7 % — AB (ref 11.5–14.5)
WBC: 7.8 10*3/uL (ref 3.6–11.0)

## 2017-01-23 LAB — TYPE AND SCREEN
ABO/RH(D): O POS
Antibody Screen: NEGATIVE
EXTEND SAMPLE REASON: UNDETERMINED

## 2017-01-28 ENCOUNTER — Encounter: Payer: Self-pay | Admitting: *Deleted

## 2017-01-29 ENCOUNTER — Ambulatory Visit: Payer: Medicaid Other | Admitting: Anesthesiology

## 2017-01-29 ENCOUNTER — Encounter
Admission: RE | Disposition: A | Discharge status: Home / Self Care | Payer: Self-pay | Source: Ambulatory Visit | Attending: Obstetrics and Gynecology

## 2017-01-29 ENCOUNTER — Encounter: Payer: Self-pay | Admitting: *Deleted

## 2017-01-29 ENCOUNTER — Ambulatory Visit
Admission: RE | Admit: 2017-01-29 | Discharge: 2017-01-29 | Disposition: A | Discharge status: Home / Self Care | Payer: Medicaid Other | Source: Ambulatory Visit | Attending: Obstetrics and Gynecology | Admitting: Obstetrics and Gynecology

## 2017-01-29 DIAGNOSIS — N736 Female pelvic peritoneal adhesions (postinfective): Secondary | ICD-10-CM | POA: Diagnosis not present

## 2017-01-29 DIAGNOSIS — Z302 Encounter for sterilization: Secondary | ICD-10-CM | POA: Diagnosis not present

## 2017-01-29 HISTORY — DX: Anemia, unspecified: D64.9

## 2017-01-29 HISTORY — PX: LAPAROSCOPIC TUBAL LIGATION: SHX1937

## 2017-01-29 LAB — TYPE AND SCREEN
ABO/RH(D): O POS
Antibody Screen: NEGATIVE

## 2017-01-29 LAB — POCT PREGNANCY, URINE: Preg Test, Ur: NEGATIVE

## 2017-01-29 SURGERY — LIGATION, FALLOPIAN TUBE, LAPAROSCOPIC
Anesthesia: General | Laterality: Right | Wound class: Clean Contaminated

## 2017-01-29 MED ORDER — LACTATED RINGERS IV SOLN: INTRAVENOUS | Status: DC

## 2017-01-29 MED ORDER — LIDOCAINE HCL (PF) 2 % IJ SOLN
INTRAMUSCULAR | Status: AC
  Filled 2017-01-29: qty 2

## 2017-01-29 MED ORDER — EPHEDRINE SULFATE 50 MG/ML IJ SOLN
INTRAMUSCULAR | Status: AC
Start: 2017-01-29 — End: ?
  Filled 2017-01-29: qty 1

## 2017-01-29 MED ORDER — FENTANYL CITRATE (PF) 100 MCG/2ML IJ SOLN
INTRAMUSCULAR | Status: AC
  Filled 2017-01-29: qty 2

## 2017-01-29 MED ORDER — BUPIVACAINE HCL 0.5 % IJ SOLN
INTRAMUSCULAR | Status: DC | PRN
  Administered 2017-01-29: 8 mL

## 2017-01-29 MED ORDER — PROPOFOL 10 MG/ML IV BOLUS
INTRAVENOUS | Status: DC | PRN
  Administered 2017-01-29: 120 mg via INTRAVENOUS

## 2017-01-29 MED ORDER — ONDANSETRON HCL 4 MG/2ML IJ SOLN
INTRAMUSCULAR | Status: DC | PRN
  Administered 2017-01-29: 4 mg via INTRAVENOUS

## 2017-01-29 MED ORDER — ONDANSETRON HCL 4 MG/2ML IJ SOLN: 4.0000 mg | Freq: Once | INTRAMUSCULAR | Status: DC | PRN

## 2017-01-29 MED ORDER — FENTANYL CITRATE (PF) 100 MCG/2ML IJ SOLN
INTRAMUSCULAR | Status: DC | PRN
  Administered 2017-01-29: 25 ug via INTRAVENOUS
  Administered 2017-01-29: 50 ug via INTRAVENOUS

## 2017-01-29 MED ORDER — MIDAZOLAM HCL 2 MG/2ML IJ SOLN
INTRAMUSCULAR | Status: DC | PRN
  Administered 2017-01-29: 2 mg via INTRAVENOUS

## 2017-01-29 MED ORDER — MIDAZOLAM HCL 2 MG/2ML IJ SOLN
INTRAMUSCULAR | Status: AC
  Filled 2017-01-29: qty 2

## 2017-01-29 MED ORDER — ROCURONIUM BROMIDE 100 MG/10ML IV SOLN
INTRAVENOUS | Status: DC | PRN
  Administered 2017-01-29: 5 mg via INTRAVENOUS
  Administered 2017-01-29: 15 mg via INTRAVENOUS

## 2017-01-29 MED ORDER — SUCCINYLCHOLINE CHLORIDE 20 MG/ML IJ SOLN
INTRAMUSCULAR | Status: DC | PRN
  Administered 2017-01-29: 100 mg via INTRAVENOUS

## 2017-01-29 MED ORDER — LIDOCAINE HCL (CARDIAC) 20 MG/ML IV SOLN
INTRAVENOUS | Status: DC | PRN
  Administered 2017-01-29: 40 mg via INTRAVENOUS

## 2017-01-29 MED ORDER — LACTATED RINGERS IV SOLN
INTRAVENOUS | Status: DC
  Administered 2017-01-29 (×2): via INTRAVENOUS

## 2017-01-29 MED ORDER — FENTANYL CITRATE (PF) 100 MCG/2ML IJ SOLN: 25.0000 ug | INTRAMUSCULAR | Status: DC | PRN

## 2017-01-29 MED ORDER — FAMOTIDINE 20 MG PO TABS
ORAL_TABLET | ORAL | Status: AC
  Administered 2017-01-29: 20 mg via ORAL
  Filled 2017-01-29: qty 1

## 2017-01-29 MED ORDER — DEXAMETHASONE SODIUM PHOSPHATE 10 MG/ML IJ SOLN
INTRAMUSCULAR | Status: DC | PRN
  Administered 2017-01-29: 10 mg via INTRAVENOUS

## 2017-01-29 MED ORDER — DEXAMETHASONE SODIUM PHOSPHATE 10 MG/ML IJ SOLN
INTRAMUSCULAR | Status: AC
  Filled 2017-01-29: qty 1

## 2017-01-29 MED ORDER — PROPOFOL 10 MG/ML IV BOLUS
INTRAVENOUS | Status: AC
  Filled 2017-01-29: qty 20

## 2017-01-29 MED ORDER — FAMOTIDINE 20 MG PO TABS
20.0000 mg | ORAL_TABLET | Freq: Once | ORAL | Status: AC
  Administered 2017-01-29: 20 mg via ORAL

## 2017-01-29 MED ORDER — KETOROLAC TROMETHAMINE 30 MG/ML IJ SOLN
INTRAMUSCULAR | Status: DC | PRN
Start: 2017-01-29 — End: 2017-01-29
  Administered 2017-01-29: 30 mg via INTRAVENOUS

## 2017-01-29 MED ORDER — SUGAMMADEX SODIUM 200 MG/2ML IV SOLN
INTRAVENOUS | Status: DC | PRN
  Administered 2017-01-29: 140 mg via INTRAVENOUS

## 2017-01-29 MED ORDER — BUPIVACAINE HCL (PF) 0.5 % IJ SOLN
INTRAMUSCULAR | Status: AC
  Filled 2017-01-29: qty 30

## 2017-01-29 SURGICAL SUPPLY — 27 items
BLADE SURG SZ11 CARB STEEL (BLADE) ×3 IMPLANT
CATH ROBINSON RED A/P 16FR (CATHETERS) ×3 IMPLANT
CLOSURE WOUND 1/2 X4 (GAUZE/BANDAGES/DRESSINGS)
CLOSURE WOUND 1/4X4 (GAUZE/BANDAGES/DRESSINGS)
GLOVE BIO SURGEON STRL SZ8 (GLOVE) ×3 IMPLANT
GOWN STRL REUS W/ TWL LRG LVL3 (GOWN DISPOSABLE) ×1 IMPLANT
GOWN STRL REUS W/ TWL XL LVL3 (GOWN DISPOSABLE) ×1 IMPLANT
GOWN STRL REUS W/TWL LRG LVL3 (GOWN DISPOSABLE) ×2
GOWN STRL REUS W/TWL XL LVL3 (GOWN DISPOSABLE) ×2
KIT RM TURNOVER CYSTO AR (KITS) ×3 IMPLANT
LABEL OR SOLS (LABEL) IMPLANT
NS IRRIG 500ML POUR BTL (IV SOLUTION) ×3 IMPLANT
PACK GYN LAPAROSCOPIC (MISCELLANEOUS) ×3 IMPLANT
PAD OB MATERNITY 4.3X12.25 (PERSONAL CARE ITEMS) ×3 IMPLANT
PAD PREP 24X41 OB/GYN DISP (PERSONAL CARE ITEMS) ×3 IMPLANT
SCISSORS METZENBAUM CVD 33 (INSTRUMENTS) ×3 IMPLANT
SLEEVE ENDOPATH XCEL 5M (ENDOMECHANICALS) ×3 IMPLANT
STRIP CLOSURE SKIN 1/2X4 (GAUZE/BANDAGES/DRESSINGS) IMPLANT
STRIP CLOSURE SKIN 1/4X4 (GAUZE/BANDAGES/DRESSINGS) IMPLANT
SUT VIC AB 0 CT1 36 (SUTURE) IMPLANT
SUT VIC AB 2-0 UR6 27 (SUTURE) ×3 IMPLANT
SUT VIC AB 4-0 SH 27 (SUTURE) ×2
SUT VIC AB 4-0 SH 27XANBCTRL (SUTURE) ×1 IMPLANT
SWABSTK COMLB BENZOIN TINCTURE (MISCELLANEOUS) IMPLANT
TROCAR ENDO BLADELESS 11MM (ENDOMECHANICALS) IMPLANT
TROCAR XCEL NON-BLD 5MMX100MML (ENDOMECHANICALS) ×3 IMPLANT
TUBING INSUFFLATOR HI FLOW (MISCELLANEOUS) ×3 IMPLANT

## 2017-01-29 NOTE — Anesthesia Post-op Follow-up Note (Cosign Needed)
Anesthesia QCDR form completed.        

## 2017-01-29 NOTE — Anesthesia Preprocedure Evaluation (Signed)
Anesthesia Evaluation  Patient identified by MRN, date of birth, ID band Patient awake    Reviewed: Allergy & Precautions, H&P , NPO status , Patient's Chart, lab work & pertinent test results, reviewed documented beta blocker date and time   Airway Mallampati: II  TM Distance: >3 FB Neck ROM: full    Dental  (+) Teeth Intact   Pulmonary neg pulmonary ROS,    Pulmonary exam normal        Cardiovascular negative cardio ROS Normal cardiovascular exam Rhythm:regular Rate:Normal     Neuro/Psych negative neurological ROS  negative psych ROS   GI/Hepatic negative GI ROS, Neg liver ROS,   Endo/Other  negative endocrine ROS  Renal/GU negative Renal ROS  negative genitourinary   Musculoskeletal   Abdominal   Peds  Hematology negative hematology ROS (+) anemia ,   Anesthesia Other Findings Past Medical History: No date: Anemia Past Surgical History: 12/20/2016: TUBAL LIGATION; N/A     Comment:  Procedure: POST PARTUM TUBAL LIGATION;  Surgeon:               Suzy BouchardSchermerhorn, Thomas J, MD;  Location: [facility] ORS;                Service: Gynecology;  Laterality: N/A;   Reproductive/Obstetrics negative OB ROS                             Anesthesia Physical Anesthesia Plan  ASA: II  Anesthesia Plan: General ETT   Post-op Pain Management:    Induction:   PONV Risk Score and Plan:   Airway Management Planned:   Additional Equipment:   Intra-op Plan:   Post-operative Plan:   Informed Consent: I have reviewed the patients History and Physical, chart, labs and discussed the procedure including the risks, benefits and alternatives for the proposed anesthesia with the patient or authorized representative who has indicated his/her understanding and acceptance.   Dental Advisory Given  Plan Discussed with: CRNA  Anesthesia Plan Comments:         Anesthesia Quick Evaluation

## 2017-01-29 NOTE — Anesthesia Postprocedure Evaluation (Signed)
Anesthesia Post Note  Patient: Melissa PertDeysi Curry  Procedure(s) Performed: Procedure(s) (LRB): LAPAROSCOPIC TUBAL LIGATION (Right)  Patient location during evaluation: PACU Anesthesia Type: General Level of consciousness: awake and alert Pain management: pain level controlled Vital Signs Assessment: post-procedure vital signs reviewed and stable Respiratory status: spontaneous breathing, nonlabored ventilation, respiratory function stable and patient connected to nasal cannula oxygen Cardiovascular status: blood pressure returned to baseline and stable Postop Assessment: no signs of nausea or vomiting Anesthetic complications: no     Last Vitals:  Vitals:   01/29/17 0925 01/29/17 1030  BP: 129/76 122/86  Pulse: (!) 55 80  Resp: 16 16  Temp: (!) 36.1 C     Last Pain:  Vitals:   01/29/17 1030  TempSrc:   PainSc: 3                  Yevette EdwardsJames G Adams

## 2017-01-29 NOTE — Progress Notes (Signed)
Ready for L/S right tubal cautery  NPO . All questions answered  Preceed

## 2017-01-29 NOTE — Transfer of Care (Signed)
Immediate Anesthesia Transfer of Care Note  Patient: Melissa Curry  Procedure(s) Performed: Procedure(s): LAPAROSCOPIC TUBAL LIGATION (Right)  Patient Location: PACU  Anesthesia Type:General  Level of Consciousness: sedated  Airway & Oxygen Therapy: Patient Spontanous Breathing and Patient connected to nasal cannula oxygen  Post-op Assessment: Report given to RN and Post -op Vital signs reviewed and stable  Post vital signs: Reviewed and stable  Last Vitals:  Vitals:   01/29/17 0624  BP: 112/77  Pulse: 69  Resp: 14  Temp: (!) 36.4 C    Last Pain:  Vitals:   01/29/17 0624  TempSrc: Tympanic  PainSc: 0-No pain         Complications: No apparent anesthesia complications

## 2017-01-29 NOTE — Brief Op Note (Signed)
01/29/2017  8:26 AM  PATIENT:  Clayburn Perteysi Stinnette  31 y.o. female  PRE-OPERATIVE DIAGNOSIS:  Elective Sterilization  No fallopian tube lumen seen on pathology from 12/20/2016  POST-OPERATIVE DIAGNOSIS:  Elective Sterilization  No fallopian tube lumen seen on pathology   PROCEDURE:  Procedure(s): LAPAROSCOPIC TUBAL LIGATION (Right) L/s bilateral tubal cautery  LOA SURGEON:  Surgeon(s) and Role:    * Schermerhorn, Ihor Austinhomas J, MD - Primary  PHYSICIAN ASSISTANT:   ASSISTANTS: none   ANESTHESIA:   general  EBL:  Total I/O In: 500 [I.V.:500] Out: 305 [Urine:300; Blood:5]  BLOOD ADMINISTERED:none  DRAINS: none   LOCAL MEDICATIONS USED:  MARCAINE     SPECIMEN:  No Specimen  DISPOSITION OF SPECIMEN:  N/A  COUNTS:  YES  TOURNIQUET:  * No tourniquets in log *  DICTATION: .Other Dictation: Dictation Number verbal  PLAN OF CARE: Discharge to home after PACU  PATIENT DISPOSITION:  PACU - hemodynamically stable.   Delay start of Pharmacological VTE agent (>24hrs) due to surgical blood loss or risk of bleeding: not applicable

## 2017-01-29 NOTE — Discharge Instructions (Signed)
Laparoscopic Tubal Ligation, Care After °Refer to this sheet in the next few weeks. These instructions provide you with information about caring for yourself after your procedure. Your health care provider may also give you more specific instructions. Your treatment has been planned according to current medical practices, but problems sometimes occur. Call your health care provider if you have any problems or questions after your procedure. °What can I expect after the procedure? °After the procedure, it is common to have: °· A sore throat. °· Discomfort in your shoulder. °· Mild discomfort or cramping in your abdomen. °· Gas pains. °· Pain or soreness in the area where the surgical cut (incision) was made. °· A bloated feeling. °· Tiredness. °· Nausea. °· Vomiting. ° °Follow these instructions at home: °Medicines °· Take over-the-counter and prescription medicines only as told by your health care provider. °· Do not take aspirin because it can cause bleeding. °· Do not drive or operate heavy machinery while taking prescription pain medicine. °Activity °· Rest for the rest of the day. °· Return to your normal activities as told by your health care provider. Ask your health care provider what activities are safe for you. °Incision care ° °· Follow instructions from your health care provider about how to take care of your incision. Make sure you: °? Wash your hands with soap and water before you change your bandage (dressing). If soap and water are not available, use hand sanitizer. °? Change your dressing as told by your health care provider. °? Leave stitches (sutures) in place. They may need to stay in place for 2 weeks or longer. °· Check your incision area every day for signs of infection. Check for: °? More redness, swelling, or pain. °? More fluid or blood. °? Warmth. °? Pus or a bad smell. °Other Instructions °· Do not take baths, swim, or use a hot tub until your health care provider approves. You may take  showers. °· Keep all follow-up visits as told by your health care provider. This is important. °· Have someone help you with your daily household tasks for the first few days. °Contact a health care provider if: °· You have more redness, swelling, or pain around your incision. °· Your incision feels warm to the touch. °· You have pus or a bad smell coming from your incision. °· The edges of your incision break open after the sutures have been removed. °· Your pain does not improve after 2-3 days. °· You have a rash. °· You repeatedly become dizzy or light-headed. °· Your pain medicine is not helping. °· You are constipated. °Get help right away if: °· You have a fever. °· You faint. °· You have increasing pain in your abdomen. °· You have severe pain in one or both of your shoulders. °· You have fluid or blood coming from your sutures or from your vagina. °· You have shortness of breath or difficulty breathing. °· You have chest pain or leg pain. °· You have ongoing nausea, vomiting, or diarrhea. °This information is not intended to replace advice given to you by your health care provider. Make sure you discuss any questions you have with your health care provider. °Document Released: 01/05/2005 Document Revised: 11/21/2015 Document Reviewed: 05/29/2015 °Elsevier Interactive Patient Education © 2018 [employer] ° ° °AMBULATORY SURGERY  °DISCHARGE INSTRUCTIONS ° ° °1) The drugs that you were given will stay in your system until tomorrow so for the next 24 hours you should not: ° °A) Drive   an automobile °B) Make any legal decisions °C) Drink any alcoholic beverage ° ° °2) You may resume regular meals tomorrow.  Today it is better to start with liquids and gradually work up to solid foods. ° °You may eat anything you prefer, but it is better to start with liquids, then soup and crackers, and gradually work up to solid foods. ° ° °3) Please notify your doctor immediately if you have any unusual bleeding, trouble  breathing, redness and pain at the surgery site, drainage, fever, or pain not relieved by medication. ° ° ° °4) Additional Instructions: ° ° ° ° ° ° ° °Please contact your physician with any problems or Same Day Surgery at [phone], Monday through Friday 6 am to 4 pm, or [place] [facility] [place] [facility] number at [phone]. °

## 2017-01-29 NOTE — Anesthesia Procedure Notes (Signed)
Procedure Name: Intubation Date/Time: 01/29/2017 7:50 AM Performed by: Allean Found Pre-anesthesia Checklist: Patient identified, Emergency Drugs available, Suction available, Patient being monitored and Timeout performed Patient Re-evaluated:Patient Re-evaluated prior to induction Oxygen Delivery Method: Circle system utilized Preoxygenation: Pre-oxygenation with 100% oxygen Induction Type: IV induction Ventilation: Mask ventilation without difficulty Laryngoscope Size: Mac and 3 Grade View: Grade I Tube type: Oral Tube size: 7.0 mm Number of attempts: 1 Airway Equipment and Method: Stylet Placement Confirmation: ETT inserted through vocal cords under direct vision,  positive ETCO2 and breath sounds checked- equal and bilateral Secured at: 21 cm Tube secured with: Tape Dental Injury: Teeth and Oropharynx as per pre-operative assessment

## 2017-01-30 NOTE — Op Note (Signed)
Melissa Curry:  Melissa Curry, Melissa Curry               ACCOUNT NO.:  [id number]  MEDICAL RECORD NO.:  [id number]  LOCATION:                               FACILITY:  [facility]  PHYSICIAN:  Jennell Cornerhomas Tariyah Pendry, MDDATE OF BIRTH:  26-Jul-1985  DATE OF PROCEDURE:  01/29/2017 DATE OF DISCHARGE:  01/29/2017                              OPERATIVE REPORT   PREOPERATIVE DIAGNOSIS:  Failed postpartum tubal ligation.  POSTOPERATIVE DIAGNOSIS: 1. Failed postpartum tubal ligation. 2. Mild pelvic adhesive disease.  PROCEDURE PERFORMED: 1. Laparoscopic bilateral tubal cautery. 2. Lysis of adhesions.  SURGEON:  Jennell Cornerhomas Treyveon Mochizuki, MD  ANESTHESIA:  General endotracheal anesthesia.  SURGEON:  Jennell Cornerhomas Travor Royce, MD  ASSISTANT:  No assistant.  INDICATION:  A 31 year old gravida 3, para 3.  The patient is status post a postpartum tubal ligation on December 20, 2016, and failed to show any portion of right fallopian tube.  DESCRIPTION OF PROCEDURE:  After adequate general endotracheal anesthesia, the patient was placed in dorsal supine position with legs in the [place] stirrups.  Abdomen and vaginal prep performed.  The patient was sterilely draped.  Time-out was performed.  Straight catheterization of the bladder yielded 300 mL of urine.  A 7 mm infraumbilical incision was made after injected with 0.5% Marcaine.  A 5 mm laparoscope was advanced into the abdominal cavity under direct visualization with the Optiview cannula.  The patient's abdomen was insufflated with carbon dioxide.  Second port site was placed in left lower quadrant 3 cm medial to the left anterior iliac spine.  A 5 mm port was advanced under direct visualization.  Initial impression showed several filmy adhesions of the left fallopian tube and the right fallopian tube to the side wall. These were taken down sharply.  There was also some mid right abdominal omental adhesions to the anterior abdominal wall.  These were taken down sharply as well.   Cautery was performed on each fallopian tube with central portion cauterized on the left and 3 separate portions of the fallopian tube on the right were cauterized with the Kleppinger's.  Good hemostasis was noted.  The patient's abdomen was deflated, and the skin incisions were closed with interrupted 4-0 Vicryl sutures.  Sterile dressing applied.  Sponge stick which was previously placed in the vagina was removed.  At the end of the case, there were no complications.  The patient tolerated the procedure well.  ESTIMATED BLOOD LOSS:  Minimal.  INTRAOPERATIVE FLUIDS:  500 mL.  DISPOSITION:  The patient was taken to recovery room in good condition.          ______________________________ Jennell Cornerhomas Karla Pavone, MD     TS/MEDQ  D:  01/29/2017  T:  01/30/2017  Job:  [id number]

## 2017-09-01 IMAGING — US US OB COMP +14 WK
1 series · 13 of 28 positions shown · non-contrast
Comparison: none

CLINICAL DATA: Pregnancy.

EXAM:
OBSTETRICAL ULTRASOUND >14 WKS

[Series 1: us ob comp +14 wk · 0.22mm/px · 13 of 115 slices shown]
[im 5/115]
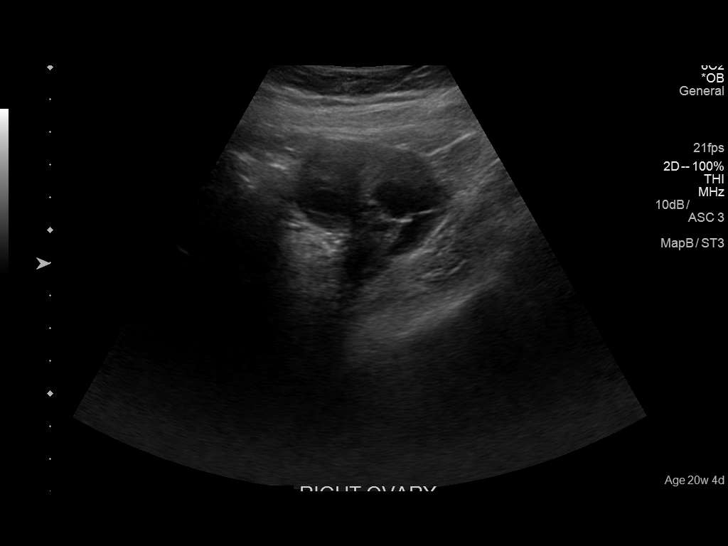
[im 13/115]
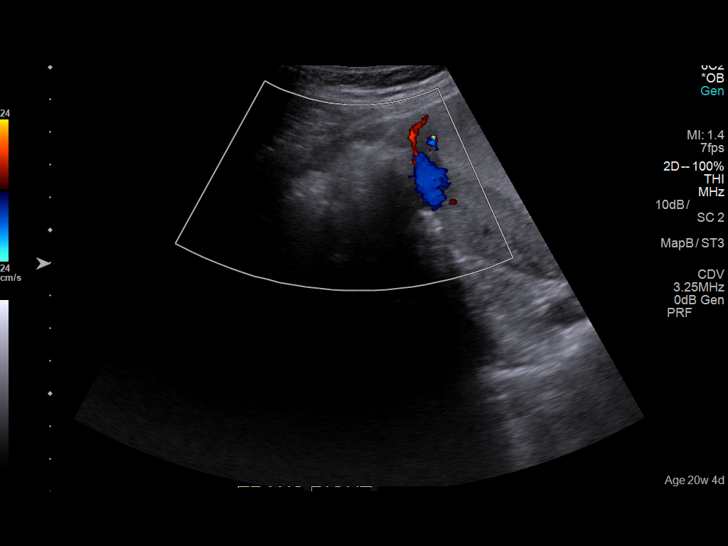
[im 22/115]
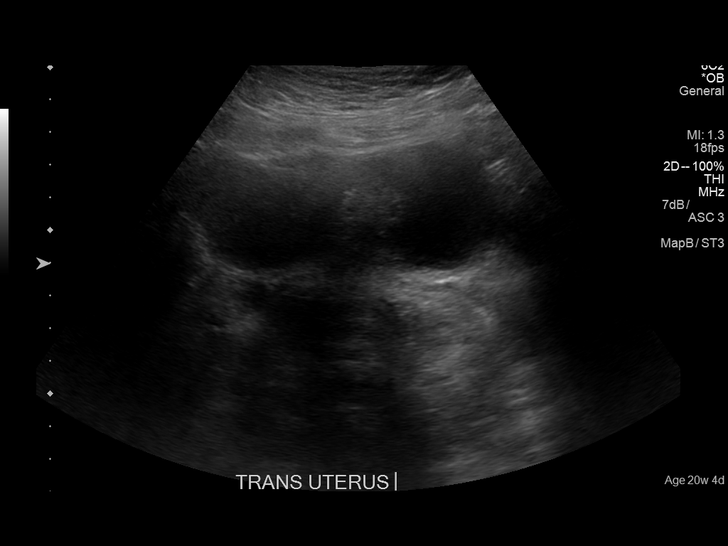
[im 30/115]
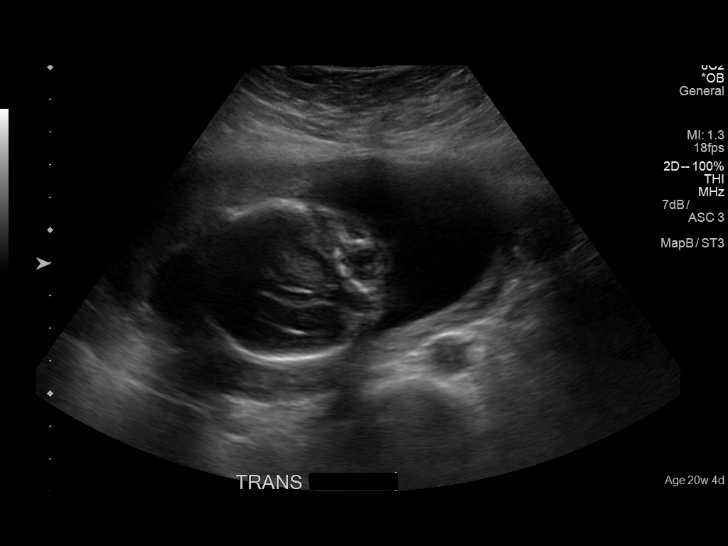
[im 39/115]
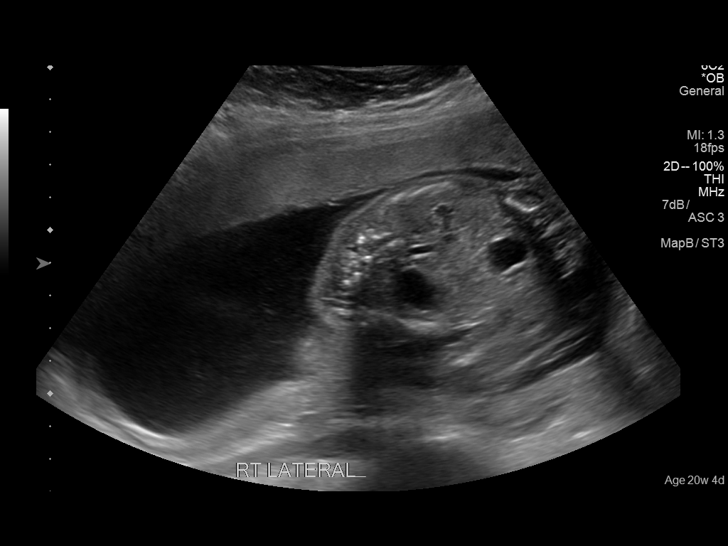
[im 47/115]
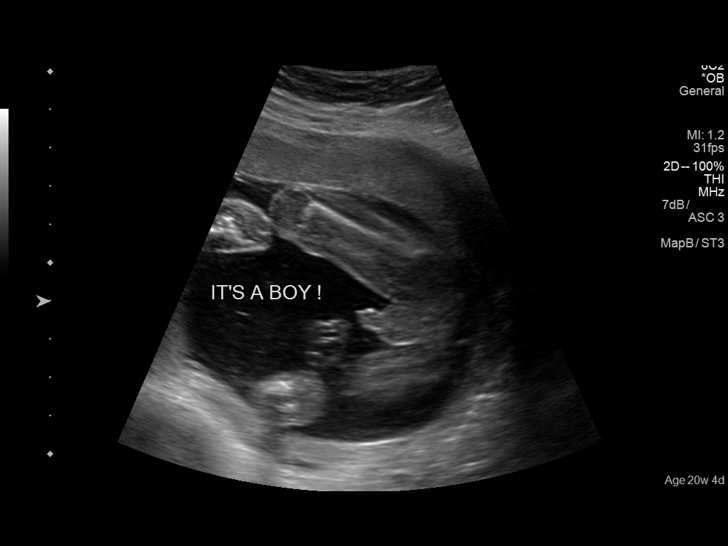
[im 60/115]
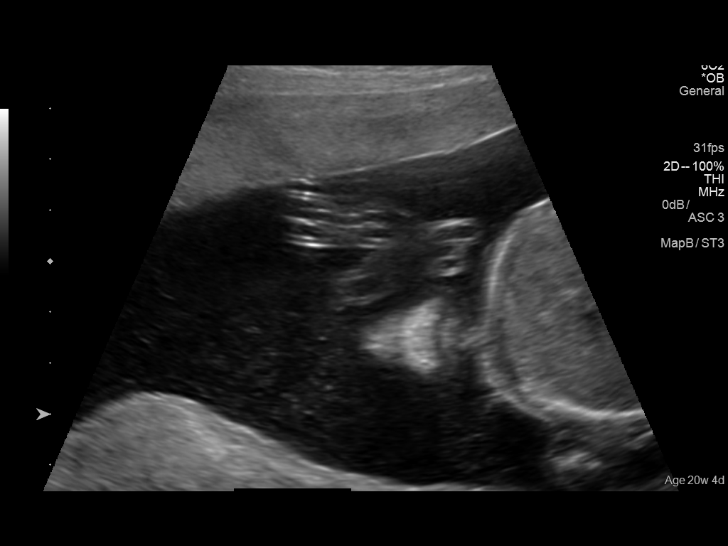
[im 68/115]
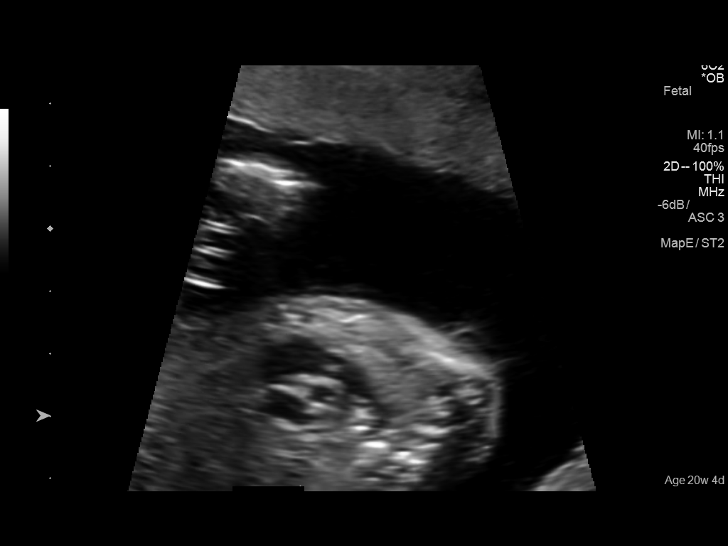
[im 77/115]
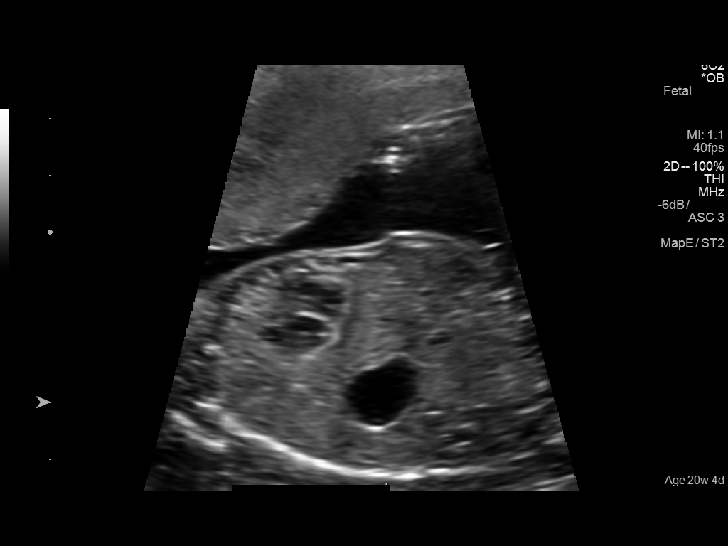
[im 85/115]
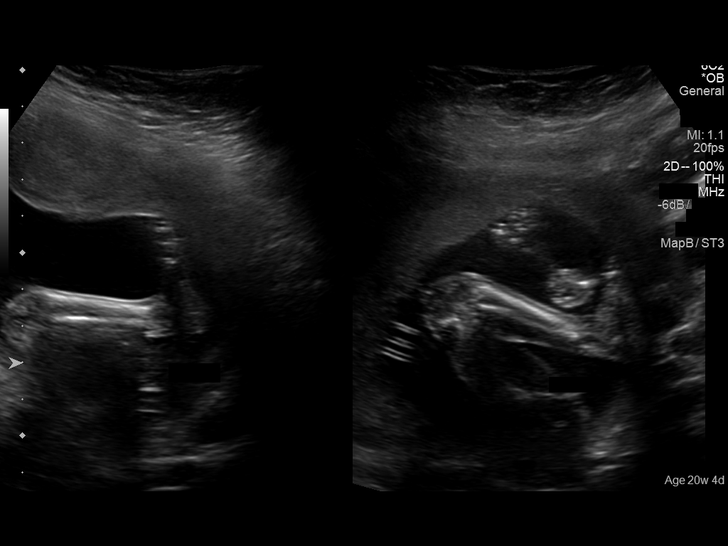
[im 93/115]
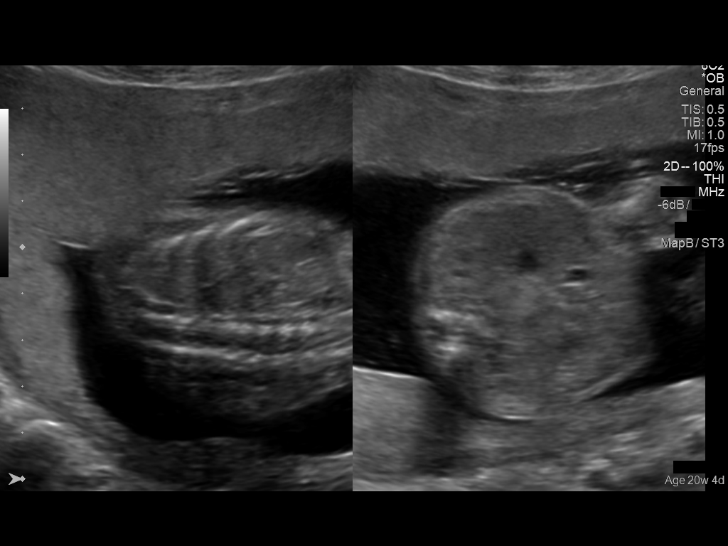
[im 102/115]
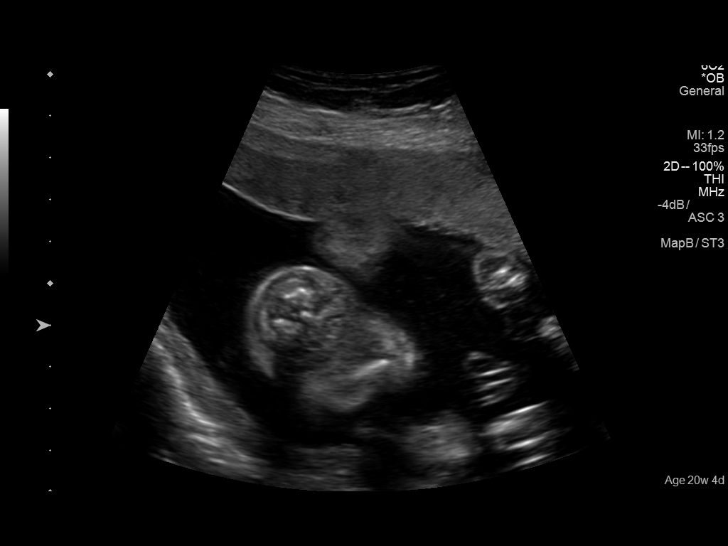
[im 110/115]
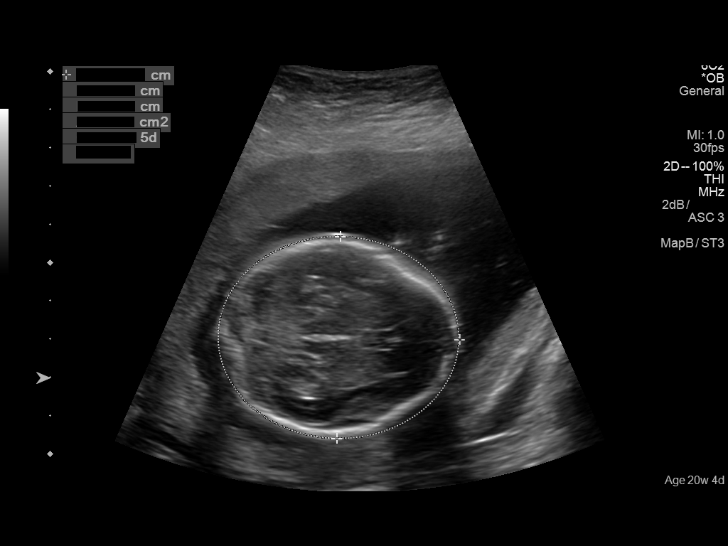

[13 of 28 positions shown; findings below may reference images not displayed]

FINDINGS: Number of Fetuses: 1

Heart Rate: Heart contractions visualized but not documented by
sonographer. Repeat exam can be obtained if needed. Movement:
Present

Presentation: Breech

Previa: No

Placental Location: Right lateral

Amniotic Fluid (Subjective): Normal

Amniotic Fluid (Objective):

Vertical pocket 3.3cm

FETAL BIOMETRY

BPD:  5.1cm 21w 2d

HC:    18.3cm  20w   5d

AC:   15.9cm  21w   0d

FL:   3.3cm  20w   1d

Current Mean GA: 20w 6d              US EDC: 12/15/2016

FETAL ANATOMY

Lateral Ventricles: Visualized

Thalami/CSP: Visualized

Posterior Fossa:  Visualized

Nuchal Region: Visualized

Upper Lip: Visualized

Spine: Visualized

4 Chamber Heart on Left: Visualized

LVOT: Visualized

RVOT: Visualized

Stomach on Left: Visualized

3 Vessel Cord: Visualized

Cord Insertion site: Visualized

Kidneys: Visualized

Bladder: Visualized

Extremities: Visualized

Technically difficult due to: Limited evaluation of the fetal spine
due to fetal position.

Maternal Findings:

Cervix:  3.1 cm and closed.
IMPRESSION: Single viable intrauterine pregnancy in breech presentation at 20
weeks 6 days. Fetal heart contractions visualized but not documented
by sonographer.

## 2017-12-18 IMAGING — US US MFM OB DETAIL+14 WK
1 series · 12 of 28 positions shown · non-contrast
Comparison: none

PATIENT INFO:

PERFORMED BY:
SERVICE(S) PROVIDED:
INDICATIONS:
36 weeks gestation of pregnancy
FETAL EVALUATION:
Num Of Fetuses:     1
Fetal Heart         137
Rate(bpm):
Presentation:       Cephalic
Placenta:           Anterior
AFI Sum(cm)     %Tile       Largest Pocket(cm)
8.7             12
BIOMETRY:
BPD:      86.6  mm     G. Age:  35w 0d         28  %    CI:        78.71   %    70 - 86
FL/HC:       21.9  %    20.1 -
HC:      308.7  mm     G. Age:  34w 3d        < 3  %    HC/AC:       0.99       0.93 -
AC:      311.6  mm     G. Age:  35w 0d         34  %    FL/BPD:      78.1  %    71 - 87
FL:       67.6  mm     G. Age:  34w 5d         18  %    FL/AC:       21.7  %    20 - 24
HUM:      61.3  mm     G. Age:  35w 3d         53  %
Est. FW:    3463   gm   5 lb 10 oz      28  %
GESTATIONAL AGE:
U/S Today:     34w 6d                                        EDD:   12/25/16
Best:          36w 0d     Det. By:  Early Ultrasound         EDD:   12/17/16
(06/13/16)
ANATOMY:
Cranium:               Within Normal Limits   Aortic Arch:            Normal appearance
Cavum:                 Visualized             Ductal Arch:            Normal appearance
previously
Ventricles:            Normal appearance      Diaphragm:              Within Normal Limits
Choroid Plexus:        Within Normal Limits   Stomach:                Seen
Cerebellum:            Visualized             Abdomen:                Within Normal
previously                                     Limits
Posterior Fossa:       Visualized             Abdominal Wall:         Visualized
previously                                     previously
Nuchal Fold:           Within Normal Limits   Cord Vessels:           3 vessels
Face:                  Orbits visualized      Kidneys:                Normal appearance
Lips:                  Visualized             Bladder:                Seen
Thoracic:              Within Normal Limits   Spine:                  Normal appearance
Heart:                 4-Chamber view         Upper Extremities:      Visualized
appears normal
RVOT:                  Normal appearance      Lower Extremities:      Visualized
LVOT:                  Normal appearance

[Series 1: us mfm ob detail+14 wk · 0.25mm/px · 12 of 60 slices shown]
[im 3/60]
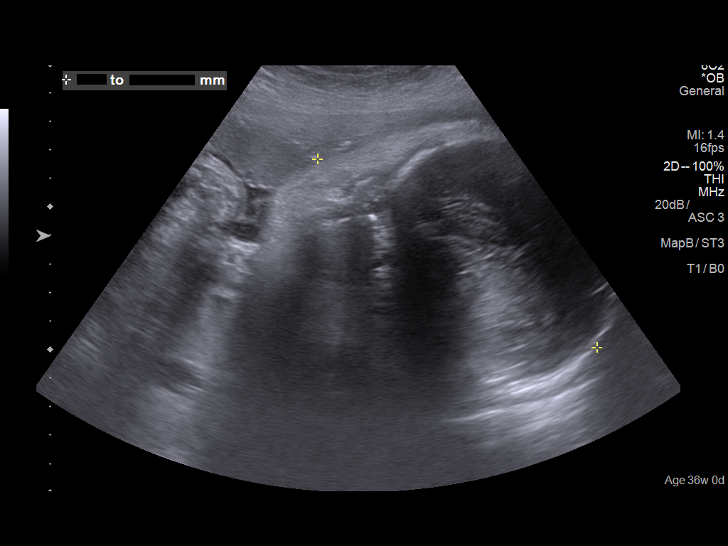
[im 7/60]
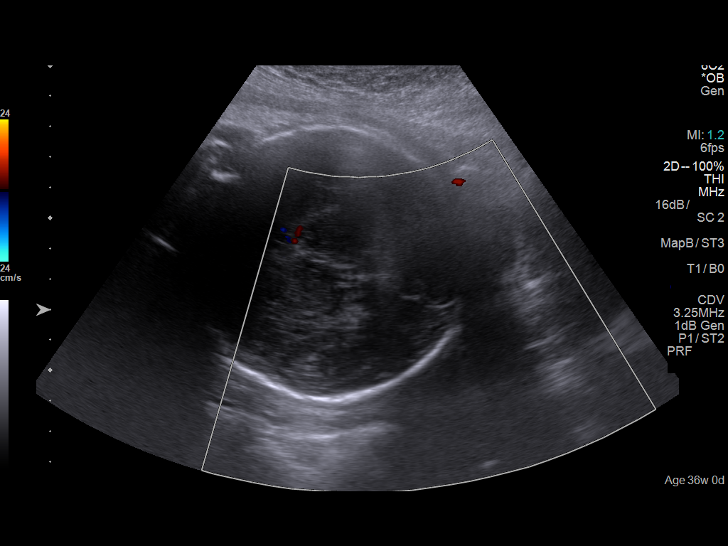
[im 11/60]
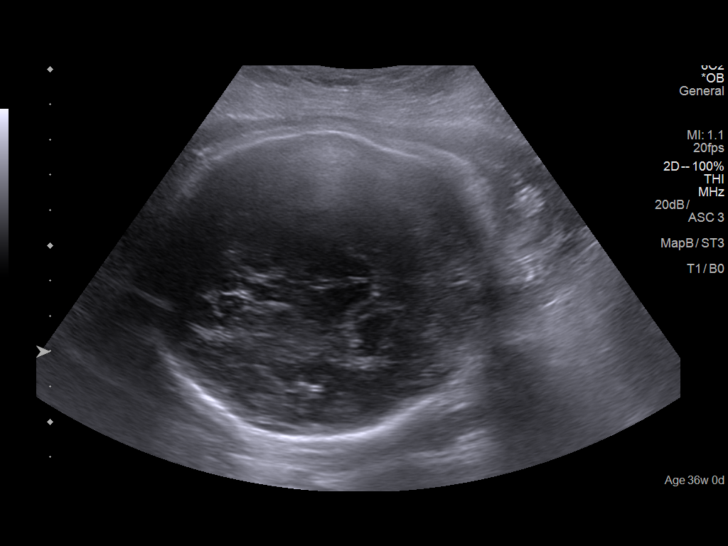
[im 18/60]
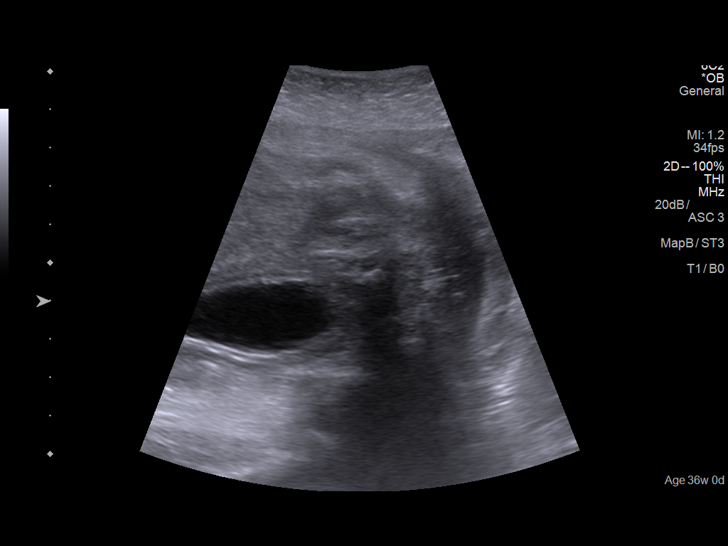
[im 22/60]
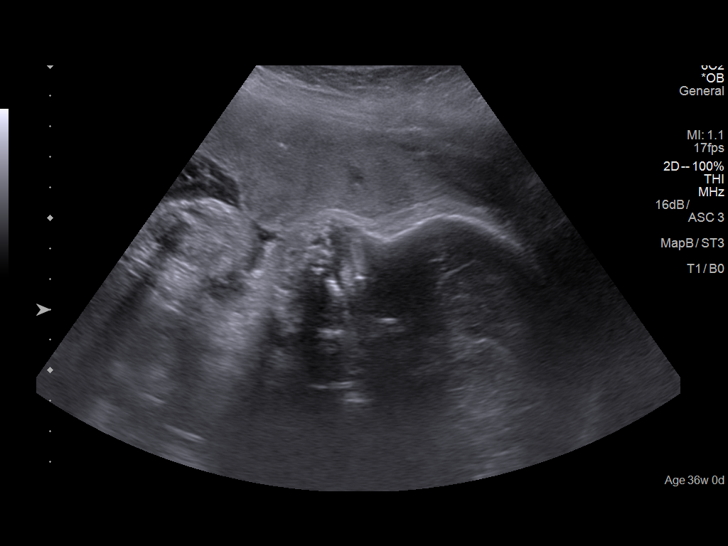
[im 27/60]
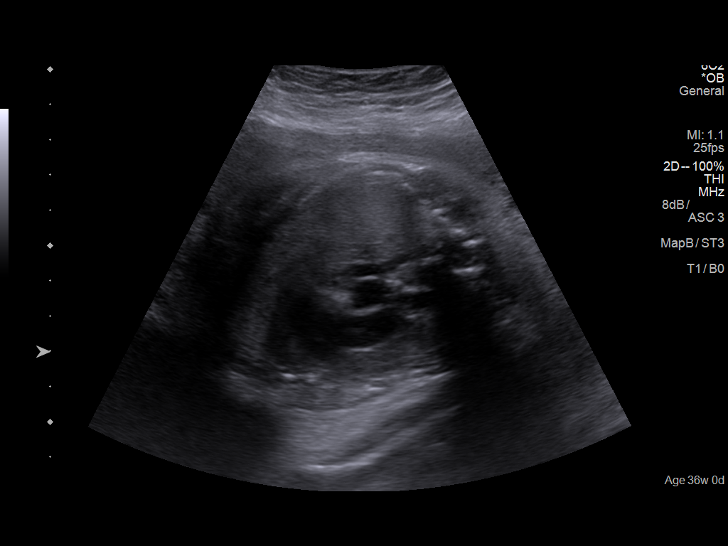
[im 33/60]
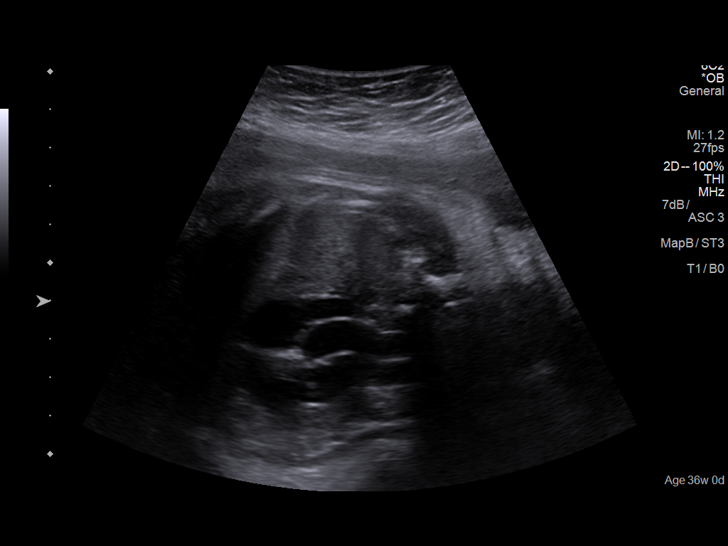
[im 38/60]
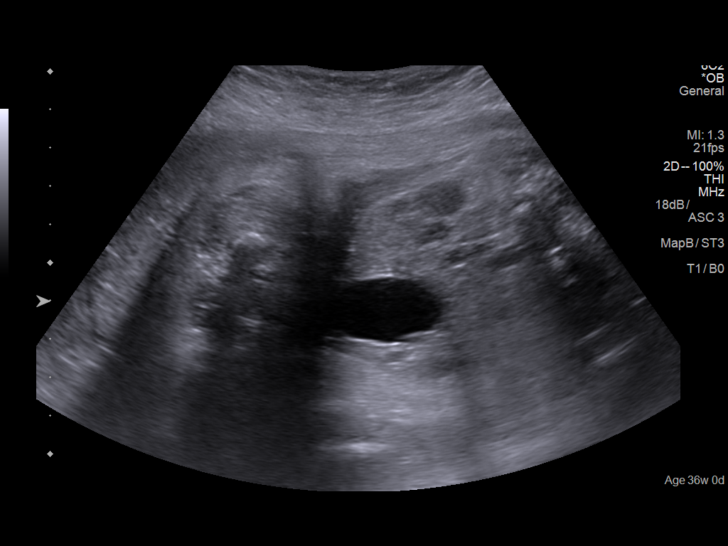
[im 42/60]
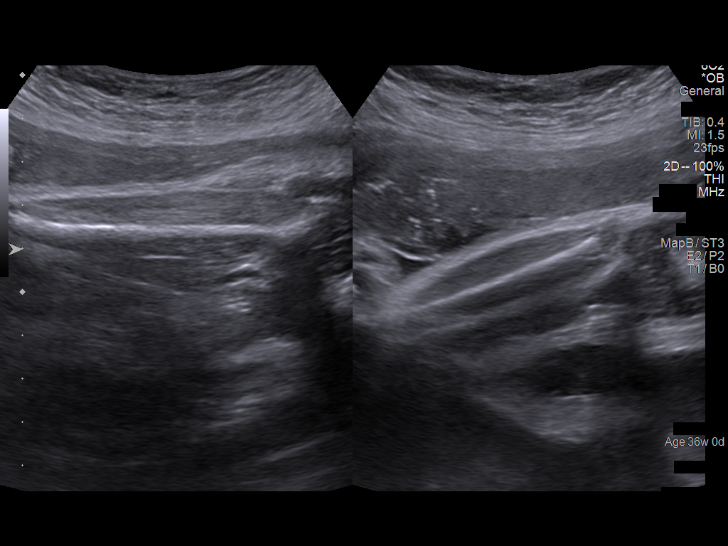
[im 49/60]
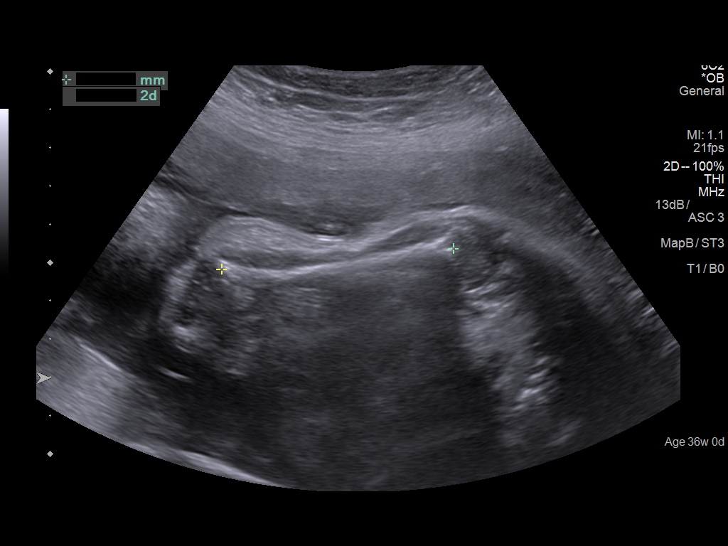
[im 53/60]
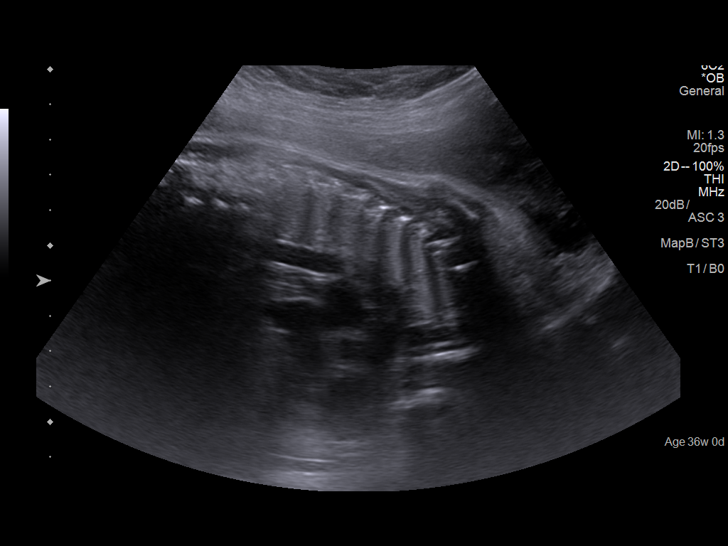
[im 57/60]
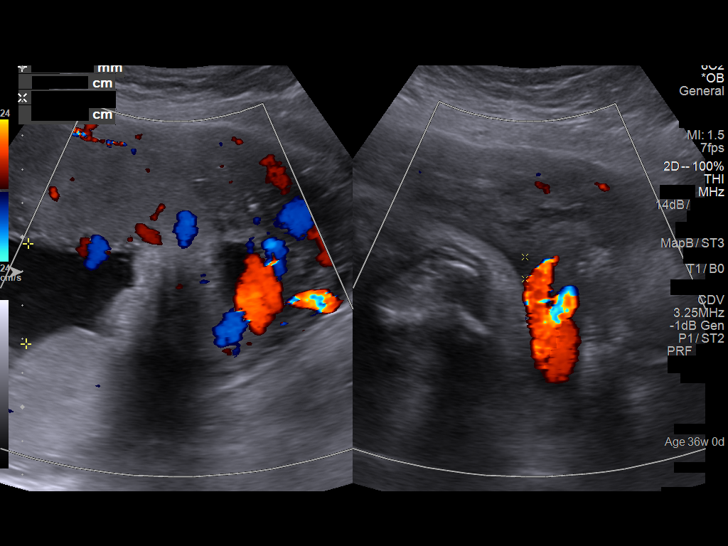

[12 of 28 positions shown; findings below may reference images not displayed]

IMPRESSION: Dear Dr.   Soqui,

Thank you for referring your patient to [facility] for fetal
growth assessment.  The patient had one previous routine
anatomy scan at 59w4d  at [HOSPITAL] on 08/03/2016.

There is a singleton gestation at 59w4d gestation by 39w8d
US performed at [HOSPITAL] on 06/13/2016.

Presentation is cephalic.

The fetal biometry correlates with established dating.

Detailed evaluation of the fetal anatomy was performed.  The
fetal anatomy was visualized today or visualized previously
(images from 08/03/2016 were reviewed) and appears normal.

Amniotic fluid volume was low normal.
RECOMMENDATIONS:

Due to a low normal amniotic fluid volume, the patient was
scheduled for a follow-up ultrasound for growth and
assessment of amniotic fluid volume in 3 weeks.

In the interim, she was encouraged to perform [facility] Samuel.
Tadanori Jayara

## 2018-01-08 IMAGING — US US MFM OB FOLLOW-UP
1 series · 13 of 28 positions shown · non-contrast
Comparison: none

PATIENT INFO:

PERFORMED BY:
SERVICE(S) PROVIDED:
INDICATIONS:
39 weeks gestation of pregnancy
F/up growth and fluid
FETAL EVALUATION:
Num Of Fetuses:     1
Fetal Heart         128
Rate(bpm):
Presentation:       Cephalic
Placenta:           Anterior, No previa
Amniotic Fluid
AFI FV:      Within normal limits
AFI Sum(cm)     %Tile       Largest Pocket(cm)
10.21           30
RUQ(cm)       RLQ(cm)       LUQ(cm)        LLQ(cm)
5.74
BIOMETRY:
BPD:      92.6  mm     G. Age:  37w 4d         46  %    CI:        77.53   %    70 - 86
FL/HC:       21.8  %    20.6 -
HC:      332.9  mm     G. Age:  38w 0d         18  %    HC/AC:       0.98       0.87 -
AC:      340.2  mm     G. Age:  37w 6d         42  %    FL/BPD:      78.3  %    71 - 87
FL:       72.5  mm     G. Age:  37w 1d         15  %    FL/AC:       21.3  %    20 - 24
HUM:      64.5  mm     G. Age:  37w 3d         44  %
Est. FW:    7373   gm     7 lb 4 oz     40  %
GESTATIONAL AGE:
U/S Today:     37w 5d                                        EDD:   12/26/16
Best:          39w 0d     Det. By:  Early Ultrasound         EDD:   12/17/16
(06/13/16)
ANATOMY:
Cavum:                 Visualized             Stomach:                Seen
previously
Ventricles:            Normal appearance      Abdominal Wall:         Visualized
Cerebellum:            Visualized             Cord Vessels:           3 vessels,
previously                                     visualized previously
Posterior Fossa:       Visualized             Kidneys:                Normal appearance
Face:                  Orbits visualized      Bladder:                Seen
Lips:                  Visualized             Spine:                  Visualized
previously                                     previously
Heart:                 4-Chamber view         Upper Extremities:      Visualized
appears normal                                 previously
RVOT:                  Normal appearance      Lower Extremities:      Visualized
LVOT:                  Normal appearance

[Series 1: us mfm ob follow-up · 0.22mm/px · 13 of 49 slices shown]
[im 2/49]
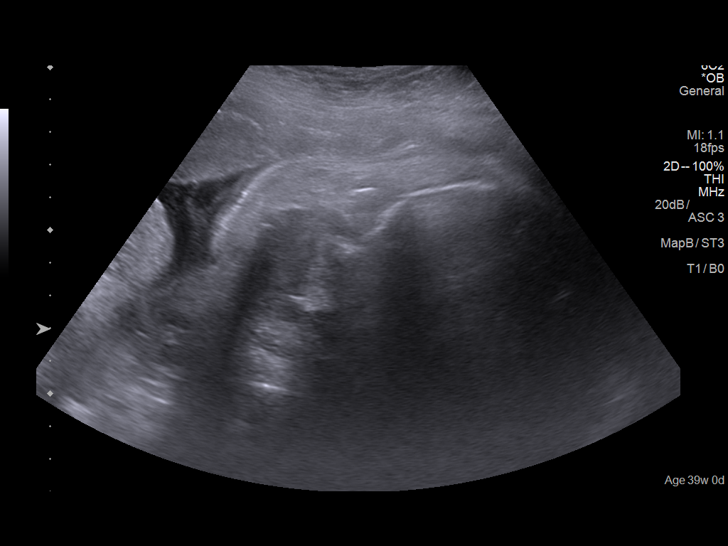
[im 6/49]
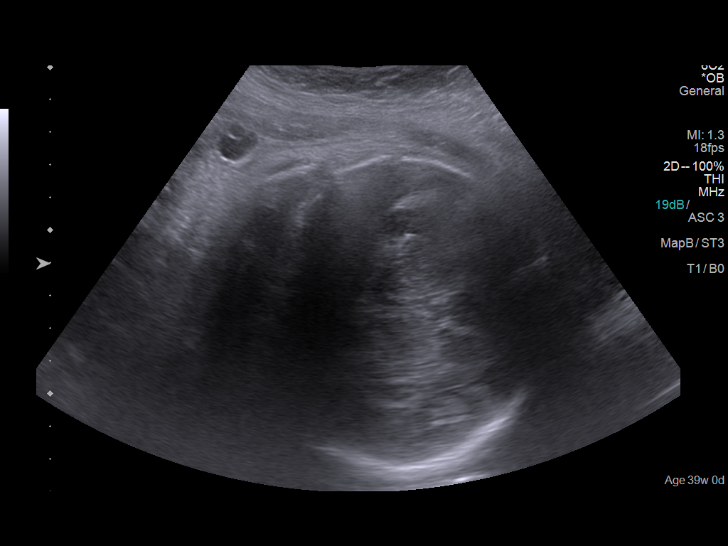
[im 9/49]
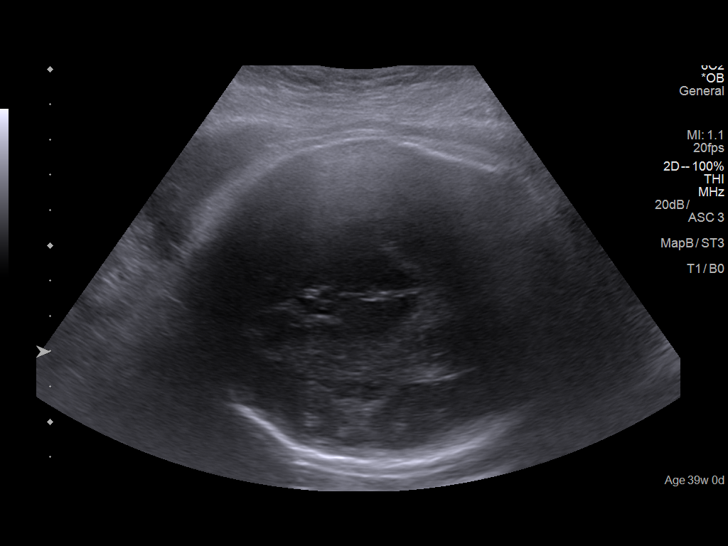
[im 13/49]
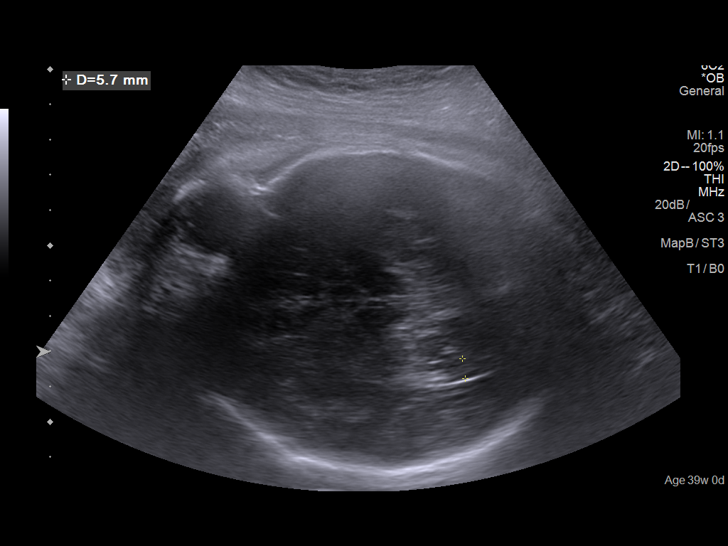
[im 17/49]
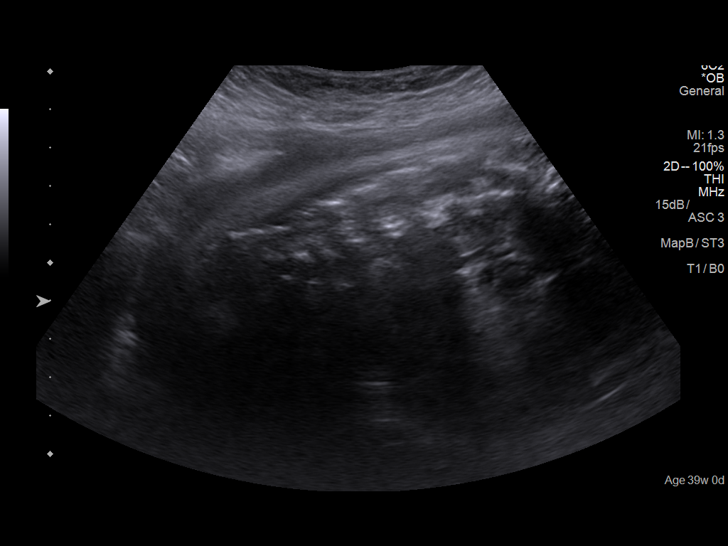
[im 20/49]
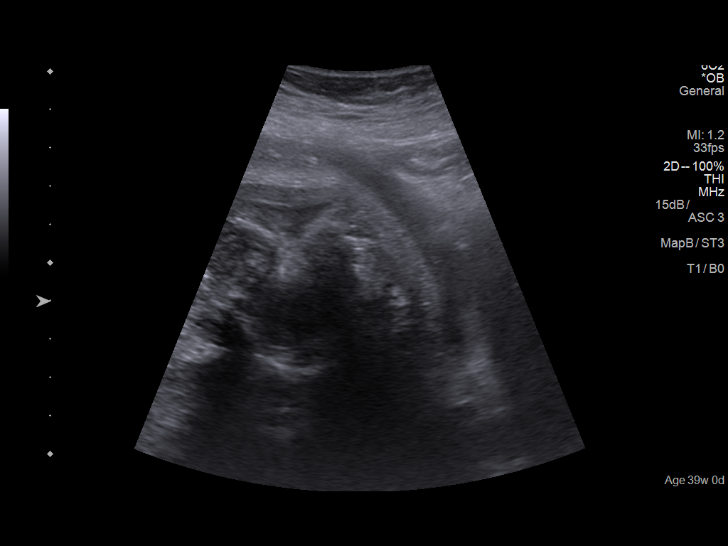
[im 25/49]
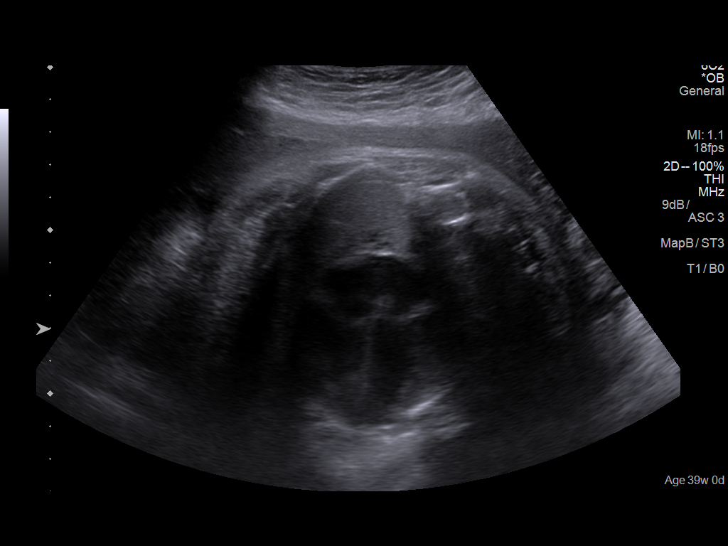
[im 29/49]
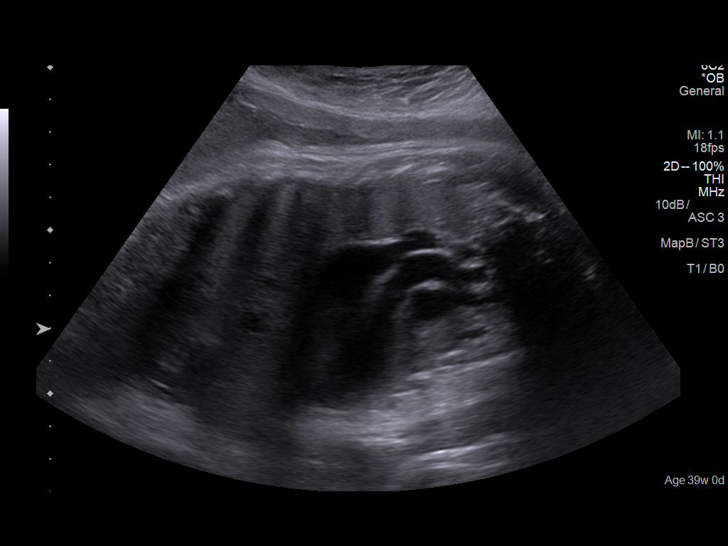
[im 33/49]
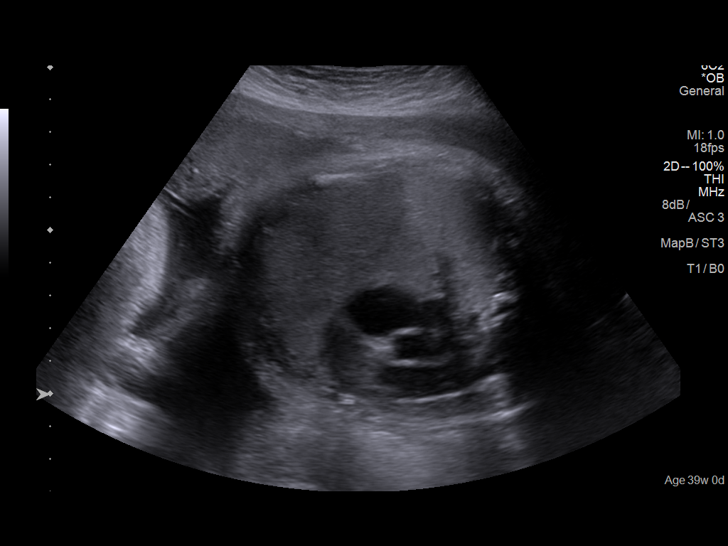
[im 36/49]
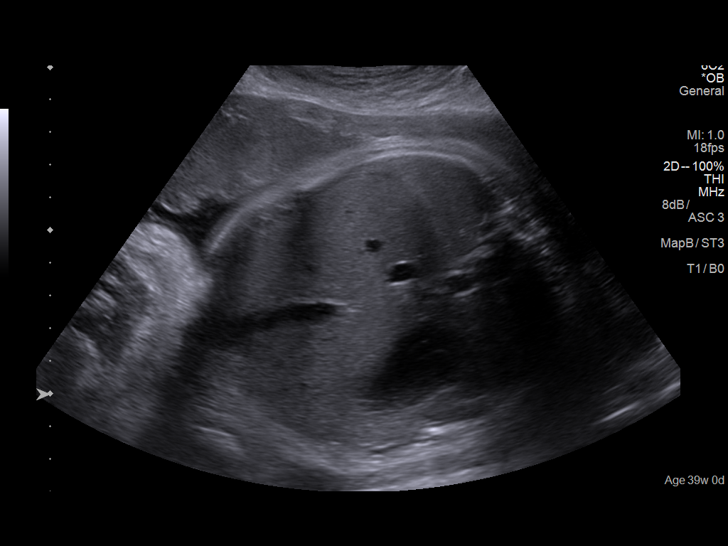
[im 40/49]
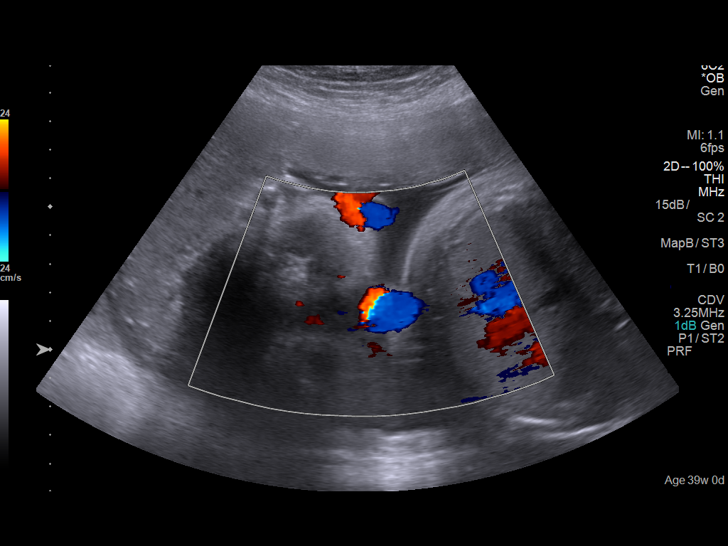
[im 43/49]
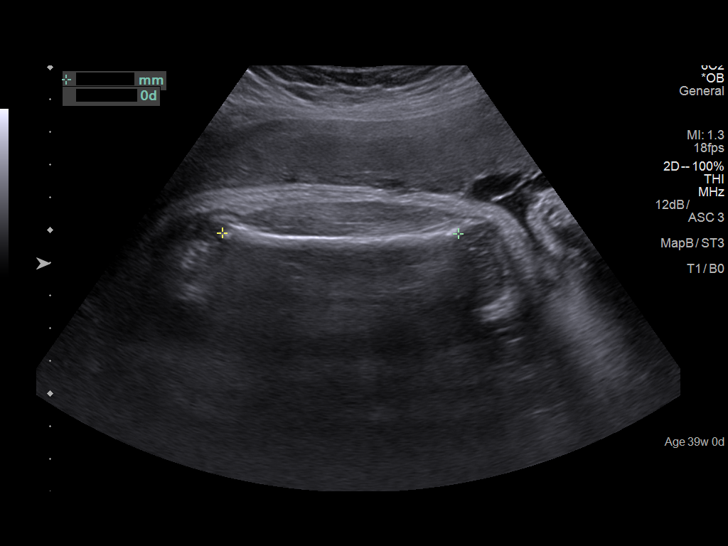
[im 47/49]
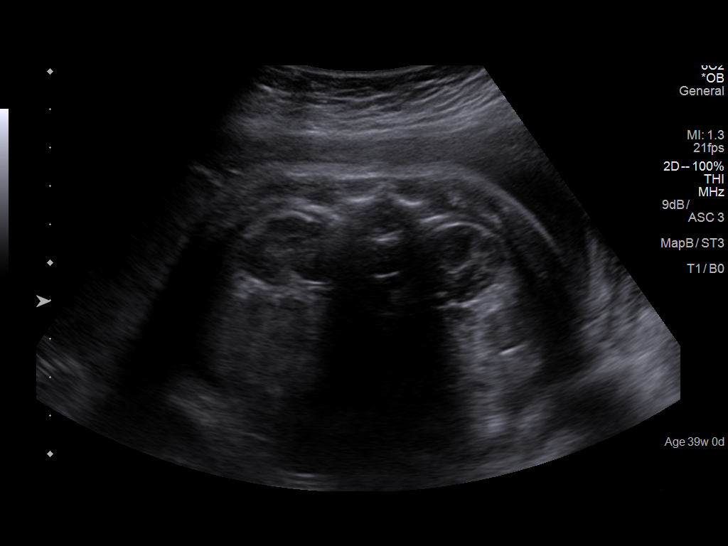

[13 of 28 positions shown; findings below may reference images not displayed]

IMPRESSION: Intrauterine pregnancy with a best estimated gestational age
of 39 weeks 0 days.  Dating is based on earliest available
ultrasound performed at [HOSPITAL] on 06/13/2016;
measurements were reported as 13 weeks 2 days.

Follow up ultrasound performed to assess growth.

Fetal anatomy was visualized or has been documented
previously (note that Dr. Antonovschi reviewed images from her
anatomy ultrasound on 08/03/16).

Appropriate interval growth.  Normal amniotic fluid volume.

## 2019-10-26 ENCOUNTER — Ambulatory Visit: Payer: Self-pay | Attending: Internal Medicine

## 2019-10-26 DIAGNOSIS — Z23 Encounter for immunization: Secondary | ICD-10-CM

## 2019-10-26 NOTE — Progress Notes (Signed)
   Covid-19 Vaccination Clinic  Name:  Melissa Curry    MRN: [id number] DOB: [birth year]  10/26/2019  Melissa Curry was observed post Covid-19 immunization for 15 minutes without incident. She was provided with Vaccine Information Sheet and instruction to access the V-Safe system.   Melissa Curry was instructed to call 911 with any severe reactions post vaccine: Marland Kitchen Difficulty breathing  . Swelling of face and throat  . A fast heartbeat  . A bad rash all over body  . Dizziness and weakness   Immunizations Administered    Name Date Dose VIS Date Route   Pfizer COVID-19 Vaccine 10/26/2019 11:31 AM 0.3 mL 08/26/2018 Intramuscular   Manufacturer: [employer], [employer]   Lot: [id number]   NDC: [address]-4

## 2019-11-17 ENCOUNTER — Ambulatory Visit: Payer: Self-pay | Attending: Internal Medicine

## 2019-11-17 DIAGNOSIS — Z23 Encounter for immunization: Secondary | ICD-10-CM

## 2019-11-17 NOTE — Progress Notes (Signed)
   Covid-19 Vaccination Clinic  Name:  Melissa Curry    MRN: [id number] DOB: [birth year]  11/17/2019  [name] was observed post Covid-19 immunization for 15 minutes without incident. She was provided with Vaccine Information Sheet and instruction to access the V-Safe system.   [name] was instructed to call 911 with any severe reactions post vaccine: Marland Kitchen Difficulty breathing  . Swelling of face and throat  . A fast heartbeat  . A bad rash all over body  . Dizziness and weakness   Immunizations Administered    Name Date Dose VIS Date Route   Pfizer COVID-19 Vaccine 11/17/2019 12:09 PM 0.3 mL 08/26/2018 Intramuscular   Manufacturer: [employer], [employer]   Lot: [id number]   NDC: [address]-2
